# Patient Record
Sex: Male | Born: 1995 | Race: Black or African American | Hispanic: No | Marital: Single | Smoking: Never smoker
Health system: Southern US, Community
[De-identification: ages and names within clinical notes are randomized; demographics above are authoritative.]

## PROBLEM LIST (undated history)

## (undated) DIAGNOSIS — F419 Anxiety disorder, unspecified: Secondary | ICD-10-CM

## (undated) DIAGNOSIS — F32A Depression, unspecified: Secondary | ICD-10-CM

## (undated) DIAGNOSIS — I10 Essential (primary) hypertension: Secondary | ICD-10-CM

## (undated) DIAGNOSIS — F329 Major depressive disorder, single episode, unspecified: Secondary | ICD-10-CM

## (undated) DIAGNOSIS — J189 Pneumonia, unspecified organism: Secondary | ICD-10-CM

## (undated) HISTORY — PX: WISDOM TOOTH EXTRACTION: SHX21

## (undated) HISTORY — DX: Essential (primary) hypertension: I10

---
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
## 2019-03-26 ENCOUNTER — Encounter (HOSPITAL_COMMUNITY): Payer: Self-pay

## 2019-03-26 ENCOUNTER — Other Ambulatory Visit: Payer: Self-pay

## 2019-03-26 ENCOUNTER — Emergency Department (HOSPITAL_COMMUNITY)
Admission: EM | Admit: 2019-03-26 | Discharge: 2019-03-26 | Disposition: A | Discharge status: Home / Self Care | Payer: Managed Care, Other (non HMO) | Attending: Emergency Medicine | Admitting: Emergency Medicine

## 2019-03-26 ENCOUNTER — Emergency Department (HOSPITAL_COMMUNITY): Payer: Managed Care, Other (non HMO)

## 2019-03-26 DIAGNOSIS — S3992XA Unspecified injury of lower back, initial encounter: Secondary | ICD-10-CM | POA: Diagnosis present

## 2019-03-26 DIAGNOSIS — M545 Low back pain, unspecified: Secondary | ICD-10-CM

## 2019-03-26 DIAGNOSIS — S32038A Other fracture of third lumbar vertebra, initial encounter for closed fracture: Secondary | ICD-10-CM | POA: Insufficient documentation

## 2019-03-26 DIAGNOSIS — Y939 Activity, unspecified: Secondary | ICD-10-CM | POA: Insufficient documentation

## 2019-03-26 DIAGNOSIS — Y999 Unspecified external cause status: Secondary | ICD-10-CM | POA: Insufficient documentation

## 2019-03-26 DIAGNOSIS — S129XXA Fracture of neck, unspecified, initial encounter: Secondary | ICD-10-CM

## 2019-03-26 DIAGNOSIS — S51811A Laceration without foreign body of right forearm, initial encounter: Secondary | ICD-10-CM | POA: Diagnosis not present

## 2019-03-26 DIAGNOSIS — Y92410 Unspecified street and highway as the place of occurrence of the external cause: Secondary | ICD-10-CM | POA: Diagnosis not present

## 2019-03-26 HISTORY — DX: Anxiety disorder, unspecified: F41.9

## 2019-03-26 HISTORY — DX: Depression, unspecified: F32.A

## 2019-03-26 MED ORDER — METHOCARBAMOL 500 MG PO TABS: 500.0000 mg | ORAL_TABLET | Freq: Two times a day (BID) | ORAL | 0 refills | Status: DC
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
## 2019-08-08 DIAGNOSIS — S335XXA Sprain of ligaments of lumbar spine, initial encounter: Secondary | ICD-10-CM | POA: Diagnosis not present

## 2019-08-08 DIAGNOSIS — S335XXD Sprain of ligaments of lumbar spine, subsequent encounter: Secondary | ICD-10-CM | POA: Diagnosis not present

## 2019-08-08 DIAGNOSIS — S33130A Subluxation of L3/L4 lumbar vertebra, initial encounter: Secondary | ICD-10-CM | POA: Diagnosis not present

## 2019-08-08 DIAGNOSIS — S33130D Subluxation of L3/L4 lumbar vertebra, subsequent encounter: Secondary | ICD-10-CM | POA: Diagnosis not present

## 2019-08-12 DIAGNOSIS — S33130D Subluxation of L3/L4 lumbar vertebra, subsequent encounter: Secondary | ICD-10-CM | POA: Diagnosis not present

## 2019-08-12 DIAGNOSIS — S335XXA Sprain of ligaments of lumbar spine, initial encounter: Secondary | ICD-10-CM | POA: Diagnosis not present

## 2019-08-12 DIAGNOSIS — S335XXD Sprain of ligaments of lumbar spine, subsequent encounter: Secondary | ICD-10-CM | POA: Diagnosis not present

## 2019-08-12 DIAGNOSIS — S33130A Subluxation of L3/L4 lumbar vertebra, initial encounter: Secondary | ICD-10-CM | POA: Diagnosis not present

## 2019-08-13 DIAGNOSIS — S33130A Subluxation of L3/L4 lumbar vertebra, initial encounter: Secondary | ICD-10-CM | POA: Diagnosis not present

## 2019-08-13 DIAGNOSIS — S335XXA Sprain of ligaments of lumbar spine, initial encounter: Secondary | ICD-10-CM | POA: Diagnosis not present

## 2019-08-13 DIAGNOSIS — M955 Acquired deformity of pelvis: Secondary | ICD-10-CM | POA: Diagnosis not present

## 2019-08-13 DIAGNOSIS — S33130D Subluxation of L3/L4 lumbar vertebra, subsequent encounter: Secondary | ICD-10-CM | POA: Diagnosis not present

## 2019-08-13 DIAGNOSIS — S335XXD Sprain of ligaments of lumbar spine, subsequent encounter: Secondary | ICD-10-CM | POA: Diagnosis not present

## 2019-08-15 DIAGNOSIS — S33130A Subluxation of L3/L4 lumbar vertebra, initial encounter: Secondary | ICD-10-CM | POA: Diagnosis not present

## 2019-08-15 DIAGNOSIS — S33130D Subluxation of L3/L4 lumbar vertebra, subsequent encounter: Secondary | ICD-10-CM | POA: Diagnosis not present

## 2019-08-15 DIAGNOSIS — S335XXA Sprain of ligaments of lumbar spine, initial encounter: Secondary | ICD-10-CM | POA: Diagnosis not present

## 2019-08-15 DIAGNOSIS — S335XXD Sprain of ligaments of lumbar spine, subsequent encounter: Secondary | ICD-10-CM | POA: Diagnosis not present

## 2019-08-19 DIAGNOSIS — S33130A Subluxation of L3/L4 lumbar vertebra, initial encounter: Secondary | ICD-10-CM | POA: Diagnosis not present

## 2019-08-19 DIAGNOSIS — S335XXA Sprain of ligaments of lumbar spine, initial encounter: Secondary | ICD-10-CM | POA: Diagnosis not present

## 2019-08-19 DIAGNOSIS — S33130D Subluxation of L3/L4 lumbar vertebra, subsequent encounter: Secondary | ICD-10-CM | POA: Diagnosis not present

## 2019-08-19 DIAGNOSIS — S335XXD Sprain of ligaments of lumbar spine, subsequent encounter: Secondary | ICD-10-CM | POA: Diagnosis not present

## 2019-08-20 DIAGNOSIS — S335XXA Sprain of ligaments of lumbar spine, initial encounter: Secondary | ICD-10-CM | POA: Diagnosis not present

## 2019-08-20 DIAGNOSIS — S33130D Subluxation of L3/L4 lumbar vertebra, subsequent encounter: Secondary | ICD-10-CM | POA: Diagnosis not present

## 2019-08-20 DIAGNOSIS — S335XXD Sprain of ligaments of lumbar spine, subsequent encounter: Secondary | ICD-10-CM | POA: Diagnosis not present

## 2019-08-20 DIAGNOSIS — S33130A Subluxation of L3/L4 lumbar vertebra, initial encounter: Secondary | ICD-10-CM | POA: Diagnosis not present

## 2019-08-22 DIAGNOSIS — S335XXA Sprain of ligaments of lumbar spine, initial encounter: Secondary | ICD-10-CM | POA: Diagnosis not present

## 2019-08-22 DIAGNOSIS — S33130D Subluxation of L3/L4 lumbar vertebra, subsequent encounter: Secondary | ICD-10-CM | POA: Diagnosis not present

## 2019-08-22 DIAGNOSIS — S335XXD Sprain of ligaments of lumbar spine, subsequent encounter: Secondary | ICD-10-CM | POA: Diagnosis not present

## 2019-08-22 DIAGNOSIS — S33130A Subluxation of L3/L4 lumbar vertebra, initial encounter: Secondary | ICD-10-CM | POA: Diagnosis not present

## 2019-08-26 DIAGNOSIS — S33130D Subluxation of L3/L4 lumbar vertebra, subsequent encounter: Secondary | ICD-10-CM | POA: Diagnosis not present

## 2019-08-26 DIAGNOSIS — S335XXD Sprain of ligaments of lumbar spine, subsequent encounter: Secondary | ICD-10-CM | POA: Diagnosis not present

## 2019-08-26 DIAGNOSIS — S33130A Subluxation of L3/L4 lumbar vertebra, initial encounter: Secondary | ICD-10-CM | POA: Diagnosis not present

## 2019-08-26 DIAGNOSIS — S335XXA Sprain of ligaments of lumbar spine, initial encounter: Secondary | ICD-10-CM | POA: Diagnosis not present

## 2019-08-27 DIAGNOSIS — S33130D Subluxation of L3/L4 lumbar vertebra, subsequent encounter: Secondary | ICD-10-CM | POA: Diagnosis not present

## 2019-08-27 DIAGNOSIS — S33130A Subluxation of L3/L4 lumbar vertebra, initial encounter: Secondary | ICD-10-CM | POA: Diagnosis not present

## 2019-08-27 DIAGNOSIS — S335XXD Sprain of ligaments of lumbar spine, subsequent encounter: Secondary | ICD-10-CM | POA: Diagnosis not present

## 2019-08-27 DIAGNOSIS — S335XXA Sprain of ligaments of lumbar spine, initial encounter: Secondary | ICD-10-CM | POA: Diagnosis not present

## 2019-08-29 DIAGNOSIS — S33130A Subluxation of L3/L4 lumbar vertebra, initial encounter: Secondary | ICD-10-CM | POA: Diagnosis not present

## 2019-08-29 DIAGNOSIS — S335XXA Sprain of ligaments of lumbar spine, initial encounter: Secondary | ICD-10-CM | POA: Diagnosis not present

## 2019-08-29 DIAGNOSIS — S335XXD Sprain of ligaments of lumbar spine, subsequent encounter: Secondary | ICD-10-CM | POA: Diagnosis not present

## 2019-08-29 DIAGNOSIS — S33130D Subluxation of L3/L4 lumbar vertebra, subsequent encounter: Secondary | ICD-10-CM | POA: Diagnosis not present

## 2019-09-02 DIAGNOSIS — S335XXD Sprain of ligaments of lumbar spine, subsequent encounter: Secondary | ICD-10-CM | POA: Diagnosis not present

## 2019-09-02 DIAGNOSIS — S33130A Subluxation of L3/L4 lumbar vertebra, initial encounter: Secondary | ICD-10-CM | POA: Diagnosis not present

## 2019-09-02 DIAGNOSIS — S33130D Subluxation of L3/L4 lumbar vertebra, subsequent encounter: Secondary | ICD-10-CM | POA: Diagnosis not present

## 2019-09-02 DIAGNOSIS — S335XXA Sprain of ligaments of lumbar spine, initial encounter: Secondary | ICD-10-CM | POA: Diagnosis not present

## 2019-09-03 DIAGNOSIS — S33130A Subluxation of L3/L4 lumbar vertebra, initial encounter: Secondary | ICD-10-CM | POA: Diagnosis not present

## 2019-09-03 DIAGNOSIS — S335XXD Sprain of ligaments of lumbar spine, subsequent encounter: Secondary | ICD-10-CM | POA: Diagnosis not present

## 2019-09-03 DIAGNOSIS — S335XXA Sprain of ligaments of lumbar spine, initial encounter: Secondary | ICD-10-CM | POA: Diagnosis not present

## 2019-09-03 DIAGNOSIS — S33130D Subluxation of L3/L4 lumbar vertebra, subsequent encounter: Secondary | ICD-10-CM | POA: Diagnosis not present

## 2019-09-05 DIAGNOSIS — S33130D Subluxation of L3/L4 lumbar vertebra, subsequent encounter: Secondary | ICD-10-CM | POA: Diagnosis not present

## 2019-09-05 DIAGNOSIS — S335XXD Sprain of ligaments of lumbar spine, subsequent encounter: Secondary | ICD-10-CM | POA: Diagnosis not present

## 2019-09-05 DIAGNOSIS — S33130A Subluxation of L3/L4 lumbar vertebra, initial encounter: Secondary | ICD-10-CM | POA: Diagnosis not present

## 2019-09-05 DIAGNOSIS — S335XXA Sprain of ligaments of lumbar spine, initial encounter: Secondary | ICD-10-CM | POA: Diagnosis not present

## 2019-09-11 DIAGNOSIS — S33130D Subluxation of L3/L4 lumbar vertebra, subsequent encounter: Secondary | ICD-10-CM | POA: Diagnosis not present

## 2019-09-11 DIAGNOSIS — S33130A Subluxation of L3/L4 lumbar vertebra, initial encounter: Secondary | ICD-10-CM | POA: Diagnosis not present

## 2019-09-11 DIAGNOSIS — S335XXA Sprain of ligaments of lumbar spine, initial encounter: Secondary | ICD-10-CM | POA: Diagnosis not present

## 2019-09-11 DIAGNOSIS — S335XXD Sprain of ligaments of lumbar spine, subsequent encounter: Secondary | ICD-10-CM | POA: Diagnosis not present

## 2019-09-17 DIAGNOSIS — S33130A Subluxation of L3/L4 lumbar vertebra, initial encounter: Secondary | ICD-10-CM | POA: Diagnosis not present

## 2019-09-17 DIAGNOSIS — S335XXA Sprain of ligaments of lumbar spine, initial encounter: Secondary | ICD-10-CM | POA: Diagnosis not present

## 2019-09-17 DIAGNOSIS — S33130D Subluxation of L3/L4 lumbar vertebra, subsequent encounter: Secondary | ICD-10-CM | POA: Diagnosis not present

## 2019-09-17 DIAGNOSIS — S335XXD Sprain of ligaments of lumbar spine, subsequent encounter: Secondary | ICD-10-CM | POA: Diagnosis not present

## 2019-09-19 DIAGNOSIS — S33130D Subluxation of L3/L4 lumbar vertebra, subsequent encounter: Secondary | ICD-10-CM | POA: Diagnosis not present

## 2019-09-19 DIAGNOSIS — S335XXA Sprain of ligaments of lumbar spine, initial encounter: Secondary | ICD-10-CM | POA: Diagnosis not present

## 2019-09-19 DIAGNOSIS — S335XXD Sprain of ligaments of lumbar spine, subsequent encounter: Secondary | ICD-10-CM | POA: Diagnosis not present

## 2019-09-19 DIAGNOSIS — S33130A Subluxation of L3/L4 lumbar vertebra, initial encounter: Secondary | ICD-10-CM | POA: Diagnosis not present

## 2019-09-24 DIAGNOSIS — S335XXA Sprain of ligaments of lumbar spine, initial encounter: Secondary | ICD-10-CM | POA: Diagnosis not present

## 2019-09-24 DIAGNOSIS — S33130D Subluxation of L3/L4 lumbar vertebra, subsequent encounter: Secondary | ICD-10-CM | POA: Diagnosis not present

## 2019-09-24 DIAGNOSIS — S335XXD Sprain of ligaments of lumbar spine, subsequent encounter: Secondary | ICD-10-CM | POA: Diagnosis not present

## 2019-09-24 DIAGNOSIS — S33130A Subluxation of L3/L4 lumbar vertebra, initial encounter: Secondary | ICD-10-CM | POA: Diagnosis not present

## 2019-09-26 DIAGNOSIS — S33130D Subluxation of L3/L4 lumbar vertebra, subsequent encounter: Secondary | ICD-10-CM | POA: Diagnosis not present

## 2019-09-26 DIAGNOSIS — S335XXD Sprain of ligaments of lumbar spine, subsequent encounter: Secondary | ICD-10-CM | POA: Diagnosis not present

## 2019-09-26 DIAGNOSIS — S335XXA Sprain of ligaments of lumbar spine, initial encounter: Secondary | ICD-10-CM | POA: Diagnosis not present

## 2019-09-26 DIAGNOSIS — S33130A Subluxation of L3/L4 lumbar vertebra, initial encounter: Secondary | ICD-10-CM | POA: Diagnosis not present

## 2019-10-01 DIAGNOSIS — S33130A Subluxation of L3/L4 lumbar vertebra, initial encounter: Secondary | ICD-10-CM | POA: Diagnosis not present

## 2019-10-01 DIAGNOSIS — S335XXA Sprain of ligaments of lumbar spine, initial encounter: Secondary | ICD-10-CM | POA: Diagnosis not present

## 2019-10-01 DIAGNOSIS — S335XXD Sprain of ligaments of lumbar spine, subsequent encounter: Secondary | ICD-10-CM | POA: Diagnosis not present

## 2019-10-01 DIAGNOSIS — S33130D Subluxation of L3/L4 lumbar vertebra, subsequent encounter: Secondary | ICD-10-CM | POA: Diagnosis not present

## 2019-10-03 DIAGNOSIS — S33130A Subluxation of L3/L4 lumbar vertebra, initial encounter: Secondary | ICD-10-CM | POA: Diagnosis not present

## 2019-10-03 DIAGNOSIS — S335XXD Sprain of ligaments of lumbar spine, subsequent encounter: Secondary | ICD-10-CM | POA: Diagnosis not present

## 2019-10-03 DIAGNOSIS — S335XXA Sprain of ligaments of lumbar spine, initial encounter: Secondary | ICD-10-CM | POA: Diagnosis not present

## 2019-10-03 DIAGNOSIS — S33130D Subluxation of L3/L4 lumbar vertebra, subsequent encounter: Secondary | ICD-10-CM | POA: Diagnosis not present

## 2019-10-08 DIAGNOSIS — S33130A Subluxation of L3/L4 lumbar vertebra, initial encounter: Secondary | ICD-10-CM | POA: Diagnosis not present

## 2019-10-08 DIAGNOSIS — S335XXD Sprain of ligaments of lumbar spine, subsequent encounter: Secondary | ICD-10-CM | POA: Diagnosis not present

## 2019-10-08 DIAGNOSIS — S335XXA Sprain of ligaments of lumbar spine, initial encounter: Secondary | ICD-10-CM | POA: Diagnosis not present

## 2019-10-08 DIAGNOSIS — S33130D Subluxation of L3/L4 lumbar vertebra, subsequent encounter: Secondary | ICD-10-CM | POA: Diagnosis not present

## 2019-10-17 DIAGNOSIS — S335XXD Sprain of ligaments of lumbar spine, subsequent encounter: Secondary | ICD-10-CM | POA: Diagnosis not present

## 2019-10-17 DIAGNOSIS — S335XXA Sprain of ligaments of lumbar spine, initial encounter: Secondary | ICD-10-CM | POA: Diagnosis not present

## 2019-10-17 DIAGNOSIS — S33130D Subluxation of L3/L4 lumbar vertebra, subsequent encounter: Secondary | ICD-10-CM | POA: Diagnosis not present

## 2019-10-17 DIAGNOSIS — S33130A Subluxation of L3/L4 lumbar vertebra, initial encounter: Secondary | ICD-10-CM | POA: Diagnosis not present

## 2020-05-22 ENCOUNTER — Other Ambulatory Visit: Payer: Self-pay

## 2020-05-22 MED ORDER — AMOXICILLIN 500 MG PO CAPS
ORAL_CAPSULE | ORAL | 0 refills | Status: DC
  Filled 2020-05-22: qty 21, 7d supply, fill #0

## 2020-06-22 ENCOUNTER — Other Ambulatory Visit: Payer: Self-pay

## 2020-06-22 MED ORDER — TRIAMCINOLONE ACETONIDE 55 MCG/ACT NA AERO: INHALATION_SPRAY | NASAL | 12 refills | Status: DC

## 2020-06-22 MED ORDER — AZELASTINE HCL 0.1 % NA SOLN
NASAL | 12 refills | Status: DC
  Filled 2020-06-22 – 2020-07-13 (×2): qty 30, 30d supply, fill #0

## 2020-07-06 ENCOUNTER — Other Ambulatory Visit: Payer: Self-pay

## 2020-07-13 ENCOUNTER — Other Ambulatory Visit: Payer: Self-pay

## 2020-07-13 MED ORDER — AMOXICILLIN 500 MG PO CAPS
500.0000 mg | ORAL_CAPSULE | ORAL | 0 refills | Status: DC
  Filled 2020-07-13: qty 2, 1d supply, fill #0

## 2020-07-13 MED ORDER — IBUPROFEN 600 MG PO TABS
600.0000 mg | ORAL_TABLET | ORAL | 0 refills | Status: DC
  Filled 2020-07-13: qty 18, 6d supply, fill #0

## 2020-07-13 MED ORDER — CHLORHEXIDINE GLUCONATE 0.12 % MT SOLN
OROMUCOSAL | 99 refills | Status: DC
  Filled 2020-07-13: qty 473, 14d supply, fill #0

## 2020-07-14 ENCOUNTER — Other Ambulatory Visit: Payer: Self-pay

## 2020-07-16 ENCOUNTER — Other Ambulatory Visit: Payer: Self-pay

## 2020-07-16 MED ORDER — HYDROCODONE-ACETAMINOPHEN 5-325 MG PO TABS
ORAL_TABLET | ORAL | 0 refills | Status: DC
  Filled 2020-07-16: qty 16, 3d supply, fill #0

## 2020-07-16 MED ORDER — AMOXICILLIN 500 MG PO CAPS
ORAL_CAPSULE | ORAL | 0 refills | Status: DC
  Filled 2020-07-16: qty 21, 7d supply, fill #0

## 2020-08-17 ENCOUNTER — Telehealth: Payer: Managed Care, Other (non HMO)

## 2020-12-04 ENCOUNTER — Other Ambulatory Visit: Payer: Self-pay

## 2020-12-04 MED ORDER — LOSARTAN POTASSIUM 50 MG PO TABS
50.0000 mg | ORAL_TABLET | Freq: Every day | ORAL | 1 refills | Status: DC
  Filled 2020-12-04 – 2021-02-16 (×2): qty 30, 30d supply, fill #0

## 2020-12-14 ENCOUNTER — Encounter: Payer: Self-pay | Admitting: *Deleted

## 2020-12-18 ENCOUNTER — Other Ambulatory Visit: Payer: Self-pay

## 2021-02-16 ENCOUNTER — Other Ambulatory Visit: Payer: Self-pay

## 2021-02-16 NOTE — Progress Notes (Signed)
02/17/2021 8:54 AM   Lee Jones 06-08-1995 [id number]  Referring provider: Alm Bustard, NP [address] [place],  [place] [address]  Chief Complaint  Patient presents with   VAS Consult      HPI: Lee Jones is a 26 y.o. male who presents today for vasectomy consult .   He denies a history of testicular trauma or pain.  No urinary issues.  No previous scrotal surgeries.  He is accompanied by his partner. He has no children and his is interested in learning about reversal.    PMH: Past Medical History:  Diagnosis Date   Anxiety    Depression    Hypertension     Surgical History: History reviewed. No pertinent surgical history.  Home Medications:  Allergies as of 02/17/2021   No Known Allergies      Medication List        Accurate as of February 17, 2021  8:54 AM. If you have any questions, ask your nurse or doctor.          STOP taking these medications    amoxicillin 500 MG capsule Commonly known as: AMOXIL Stopped by: Vanna Scotland, MD   azelastine 0.1 % nasal spray Commonly known as: ASTELIN Stopped by: Vanna Scotland, MD   chlorhexidine 0.12 % solution Commonly known as: PERIDEX Stopped by: Vanna Scotland, MD   HYDROcodone-acetaminophen 5-325 MG tablet Commonly known as: NORCO/VICODIN Stopped by: Vanna Scotland, MD   ibuprofen 600 MG tablet Commonly known as: ADVIL Stopped by: Vanna Scotland, MD   methocarbamol 500 MG tablet Commonly known as: ROBAXIN Stopped by: Vanna Scotland, MD   triamcinolone 55 MCG/ACT Aero nasal inhaler Commonly known as: NASACORT Stopped by: Vanna Scotland, MD       TAKE these medications    losartan 50 MG tablet Commonly known as: COZAAR Take 1 tablet (50 mg total) by mouth once daily        Allergies: No Known Allergies  Family History: Family History  Problem Relation Age of Onset   Chronic Renal Failure Mother    High Cholesterol Mother    Hyperlipidemia Father     Heart failure Father     Social History:  reports that he has never smoked. He has never used smokeless tobacco. He reports current alcohol use. He reports that he does not use drugs.   Physical Exam: BP (!) 171/118    Pulse 96    Ht 5\' 9"  (1.753 m)    Wt (!) 320 lb (145.2 kg)    BMI 47.26 kg/m   Constitutional:  Alert and oriented, No acute distress. HEENT: [place] AT, moist mucus membranes.  Trachea midline, no masses. Cardiovascular: No clubbing, cyanosis, or edema. Respiratory: Normal respiratory effort, no increased work of breathing. GI: Abdomen is soft, nontender, nondistended, no abdominal masses GU: Normal phallus.  Bilateral descended testicles without masses.  Despite larger habitus, vasa are easily palpable both in the supine and standing position. Skin: No rashes, bruises or suspicious lesions. Neurologic: Grossly intact, no focal deficits, moving all 4 extremities. Psychiatric: Normal mood and affect.   Assessment & Plan:    1. Vasectomy evaluation Today, we discussed what the vas deferens is, where it is located, and its function. We reviewed the procedure for vasectomy, it's risks, benefits, alternatives, and likelihood of achieving his goals. We discussed in detail the procedure, complications, and recovery as well as the need for clearance prior to unprotected intercourse. We discussed that vasectomy does not protect against  sexually transmitted diseases. We discussed that this procedure does not result in immediate sterility and that they would need to use other forms of birth control until he has been cleared with negative postvasectomy semen analyses. I explained that the procedure is considered to be permanent and that attempts at reversal have varying degrees of success. These options include vasectomy reversal, sperm retrieval, and in vitro fertilization; these can be very expensive. We discussed the chance of postvasectomy pain syndrome which occurs in less than 5% of  patients. I explained to the patient that there is no treatment to resolve this chronic pain, and that if it developed I would not be able to help resolve the issue, but that surgery is generally not needed for correction. I explained there have even been reports of systemic like illness associated with this chronic pain, and that there was no good cure. I explained that vasectomy it is not a 100% reliable form of birth control, and the risk of pregnancy after vasectomy is approximately 1 in 2000 men who had a negative postvasectomy semen analysis or rare non-motile sperm. I explained that repeat vasectomy was necessary in less than 1% of vasectomy procedures when employing the type of technique that I use. I explained that he should refrain from ejaculation for approximately one week following vasectomy. I explained that there are other options for birth control which are permanent and non-permanent; we discussed these. I explained the rates of surgical complications, such as symptomatic hematoma or infection, are low (1-2%) and vary with the surgeon's experience and criteria used to diagnose the complication.   The patient had the opportunity to ask questions to his stated satisfaction. He voiced understanding of the above factors and stated that he has read all the information provided to him and the packets and informed consent.  Given that they have some questions about his reversal today which were discussed extensively, would like him to go home and discuss this further amongst himself to be sure that they are on the same page.  We discussed that they should consider this as a permanent form of sterilization considering the barriers including financial and efficacy rates for reversal.  - He will call if he would like to schedule     I,Kailey Littlejohn,acting as a scribe for Vanna Scotland, MD.,have documented all relevant documentation on the behalf of Vanna Scotland, MD,as directed by  Vanna Scotland, MD while in the presence of Vanna Scotland, MD.  I have reviewed the above documentation for accuracy and completeness, and I agree with the above.   Vanna Scotland, MD   [facility] [address] [place], [place] [address] [phone]

## 2021-02-17 ENCOUNTER — Other Ambulatory Visit: Payer: Self-pay

## 2021-02-17 ENCOUNTER — Ambulatory Visit (INDEPENDENT_AMBULATORY_CARE_PROVIDER_SITE_OTHER): Payer: No Typology Code available for payment source | Admitting: Urology

## 2021-02-17 ENCOUNTER — Encounter: Payer: Self-pay | Admitting: Urology

## 2021-02-17 VITALS — BP 171/118 | HR 96 | Ht 69.0 in | Wt 320.0 lb

## 2021-02-17 DIAGNOSIS — Z3009 Encounter for other general counseling and advice on contraception: Secondary | ICD-10-CM | POA: Diagnosis not present

## 2021-02-17 NOTE — Patient Instructions (Addendum)

## 2021-02-25 ENCOUNTER — Other Ambulatory Visit: Payer: Self-pay

## 2021-02-25 MED ORDER — AMLODIPINE BESYLATE 5 MG PO TABS
ORAL_TABLET | ORAL | 0 refills | Status: DC
  Filled 2021-02-25: qty 30, 30d supply, fill #0

## 2021-02-26 ENCOUNTER — Other Ambulatory Visit: Payer: Self-pay

## 2021-02-26 MED ORDER — AMLODIPINE BESYLATE 10 MG PO TABS
10.0000 mg | ORAL_TABLET | Freq: Every day | ORAL | 11 refills | Status: DC
  Filled 2021-02-26: qty 90, 90d supply, fill #0

## 2021-02-26 MED ORDER — LOSARTAN POTASSIUM 100 MG PO TABS
ORAL_TABLET | ORAL | 11 refills | Status: DC
  Filled 2021-02-26: qty 90, 90d supply, fill #0
  Filled 2021-06-22: qty 90, 90d supply, fill #1

## 2021-03-03 ENCOUNTER — Other Ambulatory Visit: Payer: Self-pay

## 2021-03-03 MED ORDER — ESCITALOPRAM OXALATE 5 MG PO TABS
ORAL_TABLET | ORAL | 2 refills | Status: DC
  Filled 2021-03-03: qty 30, 30d supply, fill #0
  Filled 2021-04-07: qty 30, 30d supply, fill #1
  Filled 2021-05-13: qty 30, 30d supply, fill #2

## 2021-03-04 ENCOUNTER — Other Ambulatory Visit: Payer: Self-pay

## 2021-03-04 MED ORDER — TIZANIDINE HCL 2 MG PO TABS
ORAL_TABLET | ORAL | 0 refills | Status: DC
  Filled 2021-03-04: qty 15, 15d supply, fill #0

## 2021-03-12 ENCOUNTER — Other Ambulatory Visit: Payer: Self-pay

## 2021-03-12 MED ORDER — HYDROCHLOROTHIAZIDE 12.5 MG PO TABS
ORAL_TABLET | ORAL | 11 refills | Status: DC
  Filled 2021-03-12: qty 90, 90d supply, fill #0

## 2021-03-25 ENCOUNTER — Other Ambulatory Visit: Payer: Self-pay
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
## 2021-04-05 ENCOUNTER — Other Ambulatory Visit: Payer: Self-pay

## 2021-04-05 MED ORDER — DILTIAZEM HCL ER COATED BEADS 240 MG PO CP24
ORAL_CAPSULE | ORAL | 11 refills | Status: DC
  Filled 2021-04-05: qty 30, 30d supply, fill #0
  Filled 2021-05-13: qty 90, 90d supply, fill #1

## 2021-04-07 ENCOUNTER — Other Ambulatory Visit: Payer: Self-pay

## 2021-05-13 ENCOUNTER — Other Ambulatory Visit: Payer: Self-pay

## 2021-05-14 ENCOUNTER — Other Ambulatory Visit: Payer: Self-pay

## 2021-06-22 ENCOUNTER — Other Ambulatory Visit: Payer: Self-pay

## 2021-06-22 MED ORDER — ESCITALOPRAM OXALATE 5 MG PO TABS
ORAL_TABLET | ORAL | 2 refills | Status: DC
  Filled 2021-06-22: qty 30, 30d supply, fill #0

## 2021-06-23 ENCOUNTER — Other Ambulatory Visit: Payer: Self-pay

## 2021-08-04 ENCOUNTER — Other Ambulatory Visit: Payer: Self-pay

## 2021-08-04 MED ORDER — DILTIAZEM HCL ER COATED BEADS 240 MG PO CP24
ORAL_CAPSULE | ORAL | 1 refills | Status: DC
  Filled 2021-08-04: qty 90, 90d supply, fill #0

## 2021-08-04 MED ORDER — TELMISARTAN 40 MG PO TABS: ORAL_TABLET | ORAL | 1 refills | Status: DC

## 2021-08-04 MED ORDER — LOSARTAN POTASSIUM 100 MG PO TABS
ORAL_TABLET | ORAL | 1 refills | Status: DC
  Filled 2021-08-04 – 2021-10-04 (×2): qty 90, 90d supply, fill #0

## 2021-09-28 ENCOUNTER — Other Ambulatory Visit: Payer: Self-pay

## 2021-09-28 MED ORDER — EPINEPHRINE 0.3 MG/0.3ML IJ SOAJ
INTRAMUSCULAR | 0 refills | Status: DC
  Filled 2021-09-28: qty 2, 30d supply, fill #0

## 2021-10-04 ENCOUNTER — Other Ambulatory Visit: Payer: Self-pay

## 2021-11-29 ENCOUNTER — Other Ambulatory Visit: Payer: Self-pay

## 2021-11-29 MED ORDER — DILTIAZEM HCL ER COATED BEADS 240 MG PO CP24
240.0000 mg | ORAL_CAPSULE | Freq: Every day | ORAL | 3 refills | Status: DC
  Filled 2021-11-29: qty 46, 46d supply, fill #0
  Filled 2021-11-29: qty 44, 44d supply, fill #0

## 2021-11-29 MED ORDER — LOSARTAN POTASSIUM 100 MG PO TABS
100.0000 mg | ORAL_TABLET | Freq: Every day | ORAL | 3 refills | Status: DC
  Filled 2021-11-29 – 2022-01-05 (×2): qty 90, 90d supply, fill #0

## 2021-11-29 MED ORDER — EPINEPHRINE 0.3 MG/0.3ML IJ SOAJ
0.3000 mg | INTRAMUSCULAR | 1 refills | Status: AC | PRN
  Filled 2021-11-29: qty 1, 30d supply, fill #0

## 2021-12-03 ENCOUNTER — Other Ambulatory Visit: Payer: Self-pay

## 2021-12-16 ENCOUNTER — Ambulatory Visit: Payer: Managed Care, Other (non HMO)

## 2021-12-17 ENCOUNTER — Other Ambulatory Visit: Payer: Self-pay | Admitting: Physician Assistant

## 2021-12-17 ENCOUNTER — Ambulatory Visit
Admission: RE | Admit: 2021-12-17 | Discharge: 2021-12-17 | Disposition: A | Discharge status: Home / Self Care | Payer: No Typology Code available for payment source | Source: Ambulatory Visit | Attending: Physician Assistant | Admitting: Physician Assistant

## 2021-12-17 ENCOUNTER — Other Ambulatory Visit: Payer: Self-pay

## 2021-12-17 DIAGNOSIS — N5082 Scrotal pain: Secondary | ICD-10-CM

## 2021-12-17 MED ORDER — CIPROFLOXACIN HCL 500 MG PO TABS
ORAL_TABLET | ORAL | 0 refills | Status: DC
  Filled 2021-12-17: qty 20, 10d supply, fill #0

## 2022-01-05 ENCOUNTER — Other Ambulatory Visit: Payer: Self-pay

## 2022-02-14 ENCOUNTER — Other Ambulatory Visit: Payer: Self-pay

## 2022-02-14 MED ORDER — ESCITALOPRAM OXALATE 5 MG PO TABS
5.0000 mg | ORAL_TABLET | Freq: Every day | ORAL | 2 refills | Status: DC
  Filled 2022-02-14 – 2022-11-21 (×3): qty 30, 30d supply, fill #0

## 2022-02-17 ENCOUNTER — Other Ambulatory Visit: Payer: Self-pay

## 2022-03-29 ENCOUNTER — Other Ambulatory Visit (HOSPITAL_COMMUNITY): Payer: Self-pay

## 2022-03-29 MED ORDER — LOSARTAN POTASSIUM 100 MG PO TABS
100.0000 mg | ORAL_TABLET | Freq: Every day | ORAL | 1 refills | Status: DC
  Filled 2022-03-29: qty 90, 90d supply, fill #0
  Filled 2023-03-20: qty 90, 90d supply, fill #1
  Filled 2023-03-23: qty 90, 90d supply, fill #0

## 2022-03-29 MED ORDER — DILTIAZEM HCL ER COATED BEADS 240 MG PO CP24
240.0000 mg | ORAL_CAPSULE | Freq: Every day | ORAL | 1 refills | Status: DC
  Filled 2022-03-29: qty 90, 90d supply, fill #0

## 2022-03-29 MED ORDER — ESCITALOPRAM OXALATE 5 MG PO TABS
5.0000 mg | ORAL_TABLET | Freq: Every day | ORAL | 0 refills | Status: DC
  Filled 2022-03-29: qty 30, 30d supply, fill #0

## 2022-04-19 DIAGNOSIS — R000 Tachycardia, unspecified: Secondary | ICD-10-CM | POA: Diagnosis not present

## 2022-04-19 DIAGNOSIS — Z6841 Body Mass Index (BMI) 40.0 and over, adult: Secondary | ICD-10-CM | POA: Diagnosis not present

## 2022-04-19 DIAGNOSIS — R0602 Shortness of breath: Secondary | ICD-10-CM | POA: Diagnosis not present

## 2022-04-19 DIAGNOSIS — I10 Essential (primary) hypertension: Secondary | ICD-10-CM | POA: Diagnosis not present

## 2022-04-19 DIAGNOSIS — R002 Palpitations: Secondary | ICD-10-CM | POA: Diagnosis not present

## 2022-05-12 ENCOUNTER — Other Ambulatory Visit (HOSPITAL_COMMUNITY): Payer: Self-pay

## 2022-05-12 DIAGNOSIS — H6692 Otitis media, unspecified, left ear: Secondary | ICD-10-CM | POA: Diagnosis not present

## 2022-05-12 DIAGNOSIS — I10 Essential (primary) hypertension: Secondary | ICD-10-CM | POA: Diagnosis not present

## 2022-05-12 MED ORDER — AMOXICILLIN-POT CLAVULANATE 875-125 MG PO TABS
1.0000 | ORAL_TABLET | Freq: Two times a day (BID) | ORAL | 0 refills | Status: DC
  Filled 2022-05-12: qty 14, 7d supply, fill #0

## 2022-05-12 MED ORDER — PREDNISONE 10 MG PO TABS
ORAL_TABLET | ORAL | 0 refills | Status: DC
  Filled 2022-05-12: qty 20, 8d supply, fill #0

## 2022-05-12 MED ORDER — ESCITALOPRAM OXALATE 5 MG PO TABS
5.0000 mg | ORAL_TABLET | Freq: Every day | ORAL | 1 refills | Status: DC
  Filled 2022-05-12: qty 90, 90d supply, fill #0
  Filled 2022-08-17: qty 90, 90d supply, fill #1

## 2022-06-27 ENCOUNTER — Other Ambulatory Visit (HOSPITAL_COMMUNITY): Payer: Self-pay

## 2022-06-27 DIAGNOSIS — H6993 Unspecified Eustachian tube disorder, bilateral: Secondary | ICD-10-CM | POA: Diagnosis not present

## 2022-06-27 DIAGNOSIS — H938X3 Other specified disorders of ear, bilateral: Secondary | ICD-10-CM | POA: Diagnosis not present

## 2022-06-27 MED ORDER — DILTIAZEM HCL ER COATED BEADS 240 MG PO CP24
240.0000 mg | ORAL_CAPSULE | Freq: Every day | ORAL | 1 refills | Status: DC
  Filled 2022-06-27: qty 90, 90d supply, fill #0
  Filled 2022-09-28: qty 90, 90d supply, fill #1

## 2022-06-27 MED ORDER — LOSARTAN POTASSIUM 100 MG PO TABS
100.0000 mg | ORAL_TABLET | Freq: Every day | ORAL | 1 refills | Status: DC
  Filled 2022-06-27: qty 90, 90d supply, fill #0
  Filled 2022-09-28: qty 90, 90d supply, fill #1

## 2022-06-27 MED ORDER — AMOXICILLIN-POT CLAVULANATE 875-125 MG PO TABS
875.0000 mg | ORAL_TABLET | Freq: Two times a day (BID) | ORAL | 0 refills | Status: DC
  Filled 2022-06-27: qty 14, 7d supply, fill #0

## 2022-08-17 ENCOUNTER — Other Ambulatory Visit (HOSPITAL_COMMUNITY): Payer: Self-pay

## 2022-09-28 ENCOUNTER — Other Ambulatory Visit (HOSPITAL_COMMUNITY): Payer: Self-pay

## 2022-10-17 ENCOUNTER — Other Ambulatory Visit (HOSPITAL_COMMUNITY): Payer: Self-pay

## 2022-10-17 DIAGNOSIS — Z111 Encounter for screening for respiratory tuberculosis: Secondary | ICD-10-CM | POA: Diagnosis not present

## 2022-10-17 DIAGNOSIS — R7303 Prediabetes: Secondary | ICD-10-CM | POA: Diagnosis not present

## 2022-10-17 DIAGNOSIS — I10 Essential (primary) hypertension: Secondary | ICD-10-CM | POA: Diagnosis not present

## 2022-10-17 DIAGNOSIS — H8391 Unspecified disease of right inner ear: Secondary | ICD-10-CM | POA: Diagnosis not present

## 2022-10-17 DIAGNOSIS — F419 Anxiety disorder, unspecified: Secondary | ICD-10-CM | POA: Diagnosis not present

## 2022-10-17 DIAGNOSIS — Z0000 Encounter for general adult medical examination without abnormal findings: Secondary | ICD-10-CM | POA: Diagnosis not present

## 2022-10-17 MED ORDER — AMOXICILLIN-POT CLAVULANATE 875-125 MG PO TABS
1.0000 | ORAL_TABLET | Freq: Two times a day (BID) | ORAL | 0 refills | Status: DC
  Filled 2022-10-17: qty 20, 10d supply, fill #0

## 2022-10-20 ENCOUNTER — Other Ambulatory Visit (HOSPITAL_COMMUNITY): Payer: Self-pay

## 2022-10-20 MED ORDER — DILTIAZEM HCL ER COATED BEADS 180 MG PO CP24
180.0000 mg | ORAL_CAPSULE | Freq: Every day | ORAL | 1 refills | Status: DC
  Filled 2022-10-20: qty 30, 30d supply, fill #0

## 2022-10-24 ENCOUNTER — Other Ambulatory Visit (HOSPITAL_COMMUNITY): Payer: Self-pay

## 2022-10-24 DIAGNOSIS — F419 Anxiety disorder, unspecified: Secondary | ICD-10-CM | POA: Diagnosis not present

## 2022-10-24 DIAGNOSIS — Z6841 Body Mass Index (BMI) 40.0 and over, adult: Secondary | ICD-10-CM | POA: Diagnosis not present

## 2022-10-24 DIAGNOSIS — R002 Palpitations: Secondary | ICD-10-CM | POA: Diagnosis not present

## 2022-10-24 DIAGNOSIS — R000 Tachycardia, unspecified: Secondary | ICD-10-CM | POA: Diagnosis not present

## 2022-10-24 DIAGNOSIS — E66813 Obesity, class 3: Secondary | ICD-10-CM | POA: Diagnosis not present

## 2022-10-24 DIAGNOSIS — R0602 Shortness of breath: Secondary | ICD-10-CM | POA: Diagnosis not present

## 2022-10-24 DIAGNOSIS — F320 Major depressive disorder, single episode, mild: Secondary | ICD-10-CM | POA: Diagnosis not present

## 2022-10-24 DIAGNOSIS — I10 Essential (primary) hypertension: Secondary | ICD-10-CM | POA: Diagnosis not present

## 2022-10-24 DIAGNOSIS — R7303 Prediabetes: Secondary | ICD-10-CM | POA: Diagnosis not present

## 2022-10-24 MED ORDER — DILTIAZEM HCL ER COATED BEADS 120 MG PO CP24
120.0000 mg | ORAL_CAPSULE | Freq: Every day | ORAL | 5 refills | Status: DC
  Filled 2022-10-24 – 2022-11-21 (×2): qty 30, 30d supply, fill #0
  Filled 2022-12-28 – 2023-01-05 (×7): qty 30, 30d supply, fill #1

## 2022-10-28 ENCOUNTER — Other Ambulatory Visit (HOSPITAL_COMMUNITY): Payer: Self-pay

## 2022-10-28 MED ORDER — PREDNISONE 10 MG PO TABS
ORAL_TABLET | ORAL | 0 refills | Status: DC
  Filled 2022-10-28 – 2022-11-21 (×2): qty 20, 8d supply, fill #0

## 2022-11-03 ENCOUNTER — Other Ambulatory Visit (HOSPITAL_COMMUNITY): Payer: Self-pay

## 2022-11-09 ENCOUNTER — Other Ambulatory Visit (HOSPITAL_COMMUNITY): Payer: Self-pay

## 2022-11-21 ENCOUNTER — Other Ambulatory Visit (HOSPITAL_COMMUNITY): Payer: Self-pay

## 2022-12-26 ENCOUNTER — Other Ambulatory Visit (HOSPITAL_COMMUNITY): Payer: Self-pay

## 2022-12-26 MED ORDER — DILTIAZEM HCL ER COATED BEADS 120 MG PO CP24
ORAL_CAPSULE | ORAL | 5 refills | Status: DC
  Filled 2022-12-26: qty 30, 30d supply, fill #0
  Filled 2023-01-19 – 2023-01-23 (×2): qty 30, 30d supply, fill #1

## 2022-12-26 MED ORDER — LOSARTAN POTASSIUM 100 MG PO TABS
100.0000 mg | ORAL_TABLET | Freq: Every day | ORAL | 1 refills | Status: DC
  Filled 2022-12-26: qty 90, 90d supply, fill #0

## 2022-12-26 MED ORDER — ESCITALOPRAM OXALATE 5 MG PO TABS
5.0000 mg | ORAL_TABLET | Freq: Every day | ORAL | 1 refills | Status: DC
  Filled 2022-12-26: qty 90, 90d supply, fill #0

## 2022-12-27 ENCOUNTER — Other Ambulatory Visit (HOSPITAL_COMMUNITY): Payer: Self-pay

## 2022-12-28 ENCOUNTER — Other Ambulatory Visit (HOSPITAL_COMMUNITY): Payer: Self-pay

## 2022-12-29 ENCOUNTER — Other Ambulatory Visit (HOSPITAL_COMMUNITY): Payer: Self-pay

## 2022-12-30 ENCOUNTER — Other Ambulatory Visit (HOSPITAL_COMMUNITY): Payer: Self-pay

## 2023-01-02 ENCOUNTER — Other Ambulatory Visit (HOSPITAL_COMMUNITY): Payer: Self-pay

## 2023-01-03 ENCOUNTER — Other Ambulatory Visit (HOSPITAL_COMMUNITY): Payer: Self-pay

## 2023-01-04 ENCOUNTER — Other Ambulatory Visit (HOSPITAL_COMMUNITY): Payer: Self-pay

## 2023-01-05 ENCOUNTER — Other Ambulatory Visit (HOSPITAL_COMMUNITY): Payer: Self-pay

## 2023-01-19 ENCOUNTER — Other Ambulatory Visit: Payer: Self-pay

## 2023-01-23 ENCOUNTER — Other Ambulatory Visit: Payer: Self-pay

## 2023-02-15 ENCOUNTER — Other Ambulatory Visit: Payer: Self-pay

## 2023-02-15 ENCOUNTER — Other Ambulatory Visit (HOSPITAL_COMMUNITY): Payer: Self-pay

## 2023-02-15 DIAGNOSIS — R7303 Prediabetes: Secondary | ICD-10-CM | POA: Diagnosis not present

## 2023-02-15 DIAGNOSIS — F419 Anxiety disorder, unspecified: Secondary | ICD-10-CM | POA: Diagnosis not present

## 2023-02-15 DIAGNOSIS — I10 Essential (primary) hypertension: Secondary | ICD-10-CM | POA: Diagnosis not present

## 2023-02-15 DIAGNOSIS — F320 Major depressive disorder, single episode, mild: Secondary | ICD-10-CM | POA: Diagnosis not present

## 2023-02-15 MED ORDER — ESCITALOPRAM OXALATE 10 MG PO TABS
10.0000 mg | ORAL_TABLET | Freq: Every day | ORAL | 1 refills | Status: DC
  Filled 2023-02-15: qty 90, 90d supply, fill #0
  Filled 2023-05-09 – 2023-05-18 (×2): qty 90, 90d supply, fill #1

## 2023-02-15 MED ORDER — DILTIAZEM HCL ER COATED BEADS 180 MG PO CP24
180.0000 mg | ORAL_CAPSULE | Freq: Every day | ORAL | 1 refills | Status: DC
  Filled 2023-02-15: qty 90, 90d supply, fill #0

## 2023-03-20 ENCOUNTER — Other Ambulatory Visit (HOSPITAL_COMMUNITY): Payer: Self-pay

## 2023-03-20 ENCOUNTER — Other Ambulatory Visit: Payer: Self-pay

## 2023-03-23 ENCOUNTER — Other Ambulatory Visit: Payer: Self-pay

## 2023-03-23 ENCOUNTER — Other Ambulatory Visit (HOSPITAL_COMMUNITY): Payer: Self-pay

## 2023-03-30 ENCOUNTER — Encounter (HOSPITAL_COMMUNITY): Payer: Self-pay

## 2023-03-30 ENCOUNTER — Other Ambulatory Visit (HOSPITAL_COMMUNITY): Payer: Self-pay

## 2023-03-30 MED ORDER — ZEPBOUND 2.5 MG/0.5ML ~~LOC~~ SOAJ
2.5000 mg | SUBCUTANEOUS | 0 refills | Status: DC
Start: 2023-03-30 — End: 2023-05-03
  Filled 2023-03-30 – 2023-03-31 (×3): qty 2, 28d supply, fill #0

## 2023-03-31 ENCOUNTER — Other Ambulatory Visit (HOSPITAL_COMMUNITY): Payer: Self-pay

## 2023-03-31 ENCOUNTER — Other Ambulatory Visit (HOSPITAL_BASED_OUTPATIENT_CLINIC_OR_DEPARTMENT_OTHER): Payer: Self-pay

## 2023-04-03 ENCOUNTER — Other Ambulatory Visit (HOSPITAL_COMMUNITY): Payer: Self-pay

## 2023-04-12 ENCOUNTER — Other Ambulatory Visit (HOSPITAL_COMMUNITY): Payer: Self-pay

## 2023-04-13 ENCOUNTER — Other Ambulatory Visit (HOSPITAL_COMMUNITY): Payer: Self-pay

## 2023-04-15 ENCOUNTER — Encounter (HOSPITAL_COMMUNITY): Payer: Self-pay | Admitting: *Deleted

## 2023-04-15 ENCOUNTER — Encounter: Payer: Self-pay | Admitting: Emergency Medicine

## 2023-04-15 ENCOUNTER — Other Ambulatory Visit (HOSPITAL_COMMUNITY): Payer: Self-pay

## 2023-04-15 ENCOUNTER — Other Ambulatory Visit: Payer: Self-pay

## 2023-04-15 ENCOUNTER — Emergency Department (HOSPITAL_COMMUNITY)

## 2023-04-15 ENCOUNTER — Inpatient Hospital Stay (HOSPITAL_COMMUNITY)
Admission: EM | Admit: 2023-04-15 | Discharge: 2023-04-18 | DRG: 872 | Disposition: A | Discharge status: Home / Self Care | Attending: Family Medicine | Admitting: Family Medicine

## 2023-04-15 ENCOUNTER — Ambulatory Visit
Admission: EM | Admit: 2023-04-15 | Discharge: 2023-04-15 | Disposition: A | Discharge status: Home / Self Care | Attending: Internal Medicine | Admitting: Internal Medicine

## 2023-04-15 ENCOUNTER — Ambulatory Visit

## 2023-04-15 DIAGNOSIS — R1084 Generalized abdominal pain: Secondary | ICD-10-CM | POA: Diagnosis not present

## 2023-04-15 DIAGNOSIS — R509 Fever, unspecified: Principal | ICD-10-CM

## 2023-04-15 DIAGNOSIS — F39 Unspecified mood [affective] disorder: Secondary | ICD-10-CM | POA: Diagnosis present

## 2023-04-15 DIAGNOSIS — Z83438 Family history of other disorder of lipoprotein metabolism and other lipidemia: Secondary | ICD-10-CM

## 2023-04-15 DIAGNOSIS — I10 Essential (primary) hypertension: Secondary | ICD-10-CM | POA: Diagnosis present

## 2023-04-15 DIAGNOSIS — A4189 Other specified sepsis: Principal | ICD-10-CM | POA: Diagnosis present

## 2023-04-15 DIAGNOSIS — R7989 Other specified abnormal findings of blood chemistry: Secondary | ICD-10-CM | POA: Insufficient documentation

## 2023-04-15 DIAGNOSIS — Z1152 Encounter for screening for COVID-19: Secondary | ICD-10-CM

## 2023-04-15 DIAGNOSIS — R000 Tachycardia, unspecified: Secondary | ICD-10-CM | POA: Diagnosis not present

## 2023-04-15 DIAGNOSIS — A084 Viral intestinal infection, unspecified: Secondary | ICD-10-CM | POA: Diagnosis not present

## 2023-04-15 DIAGNOSIS — A419 Sepsis, unspecified organism: Secondary | ICD-10-CM | POA: Diagnosis present

## 2023-04-15 DIAGNOSIS — R42 Dizziness and giddiness: Secondary | ICD-10-CM

## 2023-04-15 DIAGNOSIS — Z79899 Other long term (current) drug therapy: Secondary | ICD-10-CM

## 2023-04-15 DIAGNOSIS — R109 Unspecified abdominal pain: Secondary | ICD-10-CM | POA: Diagnosis not present

## 2023-04-15 DIAGNOSIS — E66813 Obesity, class 3: Secondary | ICD-10-CM | POA: Diagnosis present

## 2023-04-15 DIAGNOSIS — R197 Diarrhea, unspecified: Secondary | ICD-10-CM

## 2023-04-15 DIAGNOSIS — R7401 Elevation of levels of liver transaminase levels: Secondary | ICD-10-CM | POA: Diagnosis present

## 2023-04-15 DIAGNOSIS — Z841 Family history of disorders of kidney and ureter: Secondary | ICD-10-CM

## 2023-04-15 DIAGNOSIS — K529 Noninfective gastroenteritis and colitis, unspecified: Secondary | ICD-10-CM | POA: Diagnosis present

## 2023-04-15 DIAGNOSIS — R112 Nausea with vomiting, unspecified: Secondary | ICD-10-CM

## 2023-04-15 DIAGNOSIS — Z91030 Bee allergy status: Secondary | ICD-10-CM

## 2023-04-15 DIAGNOSIS — A0832 Astrovirus enteritis: Secondary | ICD-10-CM | POA: Insufficient documentation

## 2023-04-15 DIAGNOSIS — F32A Depression, unspecified: Secondary | ICD-10-CM | POA: Diagnosis present

## 2023-04-15 DIAGNOSIS — Z8249 Family history of ischemic heart disease and other diseases of the circulatory system: Secondary | ICD-10-CM

## 2023-04-15 DIAGNOSIS — F419 Anxiety disorder, unspecified: Secondary | ICD-10-CM | POA: Diagnosis present

## 2023-04-15 DIAGNOSIS — Z6841 Body Mass Index (BMI) 40.0 and over, adult: Secondary | ICD-10-CM

## 2023-04-15 DIAGNOSIS — K5730 Diverticulosis of large intestine without perforation or abscess without bleeding: Secondary | ICD-10-CM | POA: Diagnosis not present

## 2023-04-15 DIAGNOSIS — E876 Hypokalemia: Secondary | ICD-10-CM | POA: Diagnosis present

## 2023-04-15 DIAGNOSIS — Z7985 Long-term (current) use of injectable non-insulin antidiabetic drugs: Secondary | ICD-10-CM

## 2023-04-15 DIAGNOSIS — N179 Acute kidney failure, unspecified: Secondary | ICD-10-CM | POA: Diagnosis present

## 2023-04-15 LAB — COMPREHENSIVE METABOLIC PANEL WITH GFR
ALT: 157 U/L — ABNORMAL HIGH (ref 0–44)
AST: 144 U/L — ABNORMAL HIGH (ref 15–41)
Albumin: 3.1 g/dL — ABNORMAL LOW (ref 3.5–5.0)
Alkaline Phosphatase: 16 U/L — ABNORMAL LOW (ref 38–126)
Anion gap: 10 (ref 5–15)
BUN: 18 mg/dL (ref 6–20)
CO2: 21 mmol/L — ABNORMAL LOW (ref 22–32)
Calcium: 8.7 mg/dL — ABNORMAL LOW (ref 8.9–10.3)
Chloride: 106 mmol/L (ref 98–111)
Creatinine, Ser: 1.52 mg/dL — ABNORMAL HIGH (ref 0.61–1.24)
GFR, Estimated: >60 mL/min (ref >60)
Glucose, Bld: 127 mg/dL — ABNORMAL HIGH (ref 70–99)
Potassium: 3.7 mmol/L (ref 3.5–5.1)
Sodium: 137 mmol/L (ref 135–145)
Total Bilirubin: 0.6 mg/dL (ref 0.0–1.2)
Total Protein: 6.7 g/dL (ref 6.5–8.1)

## 2023-04-15 LAB — URINALYSIS, ROUTINE W REFLEX MICROSCOPIC
Bilirubin Urine: NEGATIVE
Glucose, UA: NEGATIVE mg/dL
Hgb urine dipstick: NEGATIVE
Ketones, ur: NEGATIVE mg/dL
Leukocytes,Ua: NEGATIVE
Nitrite: NEGATIVE
Protein, ur: 100 mg/dL — AB
Specific Gravity, Urine: 1.038 — ABNORMAL HIGH (ref 1.005–1.030)
pH: 5 (ref 5.0–8.0)

## 2023-04-15 LAB — POC COVID19/FLU A&B COMBO
Covid Antigen, POC: NEGATIVE
Influenza A Antigen, POC: NEGATIVE
Influenza B Antigen, POC: NEGATIVE

## 2023-04-15 LAB — CBC
HCT: 43.8 % (ref 39.0–52.0)
Hemoglobin: 13.6 g/dL (ref 13.0–17.0)
MCH: 26.3 pg (ref 26.0–34.0)
MCHC: 31.1 g/dL (ref 30.0–36.0)
MCV: 84.7 fL (ref 80.0–100.0)
Platelets: 189 10*3/uL (ref 150–400)
RBC: 5.17 MIL/uL (ref 4.22–5.81)
RDW: 15 % (ref 11.5–15.5)
WBC: 10.3 10*3/uL (ref 4.0–10.5)
nRBC: 0 % (ref 0.0–0.2)

## 2023-04-15 LAB — LIPASE, BLOOD: Lipase: 31 U/L (ref 11–51)

## 2023-04-15 LAB — POCT FASTING CBG KUC MANUAL ENTRY: POCT Glucose (KUC): 108 mg/dL — AB (ref 70–99)

## 2023-04-15 MED ORDER — ONDANSETRON 4 MG PO TBDP
4.0000 mg | ORAL_TABLET | Freq: Three times a day (TID) | ORAL | 0 refills | Status: DC | PRN
  Filled 2023-04-15 (×2): qty 20, 7d supply, fill #0

## 2023-04-15 MED ORDER — IOHEXOL 350 MG/ML SOLN
75.0000 mL | Freq: Once | INTRAVENOUS | Status: AC | PRN
  Administered 2023-04-15: 75 mL via INTRAVENOUS

## 2023-04-15 MED ORDER — ONDANSETRON HCL 4 MG/2ML IJ SOLN
4.0000 mg | Freq: Once | INTRAMUSCULAR | Status: AC
  Administered 2023-04-15: 4 mg via INTRAVENOUS
  Filled 2023-04-15: qty 2

## 2023-04-15 MED ORDER — SODIUM CHLORIDE 0.9 % IV BOLUS
1000.0000 mL | Freq: Once | INTRAVENOUS | Status: AC
  Administered 2023-04-15: 1000 mL via INTRAVENOUS

## 2023-04-15 MED ORDER — SODIUM CHLORIDE 0.9 % IV BOLUS
1000.0000 mL | Freq: Once | INTRAVENOUS | Status: AC
  Administered 2023-04-16: 1000 mL via INTRAVENOUS

## 2023-04-15 MED ORDER — ONDANSETRON 4 MG PO TBDP
4.0000 mg | ORAL_TABLET | Freq: Once | ORAL | Status: AC
  Administered 2023-04-15: 4 mg via ORAL

## 2023-04-15 MED ORDER — METRONIDAZOLE 500 MG PO TABS
500.0000 mg | ORAL_TABLET | Freq: Two times a day (BID) | ORAL | Status: DC
  Administered 2023-04-16 – 2023-04-17 (×3): 500 mg via ORAL
  Filled 2023-04-15 (×3): qty 1

## 2023-04-15 MED ORDER — SODIUM CHLORIDE 0.9 % IV SOLN: INTRAVENOUS | Status: DC

## 2023-04-15 MED ORDER — KETOROLAC TROMETHAMINE 30 MG/ML IJ SOLN
30.0000 mg | Freq: Once | INTRAMUSCULAR | Status: AC
  Administered 2023-04-15: 30 mg via INTRAMUSCULAR

## 2023-04-15 MED ORDER — SODIUM CHLORIDE 0.9 % IV SOLN: Freq: Once | INTRAVENOUS | Status: DC

## 2023-04-15 MED ORDER — SODIUM CHLORIDE 0.9 % IV SOLN
2.0000 g | INTRAVENOUS | Status: DC
  Administered 2023-04-16 – 2023-04-17 (×2): 2 g via INTRAVENOUS
  Filled 2023-04-15 (×2): qty 20

## 2023-04-15 MED ORDER — MECLIZINE HCL 12.5 MG PO TABS
12.5000 mg | ORAL_TABLET | Freq: Three times a day (TID) | ORAL | 0 refills | Status: DC | PRN
  Filled 2023-04-15 (×2): qty 20, 7d supply, fill #0

## 2023-04-15 MED ORDER — ACETAMINOPHEN 500 MG PO TABS
1000.0000 mg | ORAL_TABLET | Freq: Once | ORAL | Status: AC
  Administered 2023-04-15: 1000 mg via ORAL
  Filled 2023-04-15: qty 2

## 2023-04-15 NOTE — ED Provider Notes (Signed)
 [facility]    CSN: [id number] Arrival date & time: 04/15/23  1114      History   Chief Complaint Chief Complaint  Patient presents with   Nausea   Emesis   Diarrhea    HPI Lee Jones is a 28 y.o. male.   Lee Jones is a 28 y.o. male presenting for chief complaint of nausea and diarrhea that started 3 days ago.  He has been routinely taking Zofran and has not had any episodes of emesis.  No blood or mucus to the stools.  Stools have been loose for the last 3 days with associated generalized bilateral upper and lower abdominal pain.  Pain is described as a "churning sensation" to the right upper quadrant abdomen and a burning sensation to the left upper abdomen.  Reports cramping sensation to the bilateral lower abdomen.  Temperature after taking Tylenol and Motrin last night was 9.9.  Currently with low-grade fever at 99.6 in the urgent care.  Reports dizziness that he describes as a vertigo/room spinning sensation and is worsened upon standing. He has not had anything to eat this morning and is not a diabetic. Denies cough, congestion, ear pain, flank pain, urinary symptoms, and gross hematuria. Denies recent changes in exercise habits or previous abdominal surgeries. No recent antibiotics or recent travel outside of the country. 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 Taking motrin, tylenol, and Zofran with some relief. No recent NSAIDs in the last 8 hours.    Emesis Associated symptoms: diarrhea   Diarrhea Associated symptoms: vomiting     Past Medical History:  Diagnosis Date   Anxiety    Depression    Hypertension     There are no active problems to display for this patient.   History reviewed. No pertinent surgical history.     Home Medications    Prior to Admission medications   Medication Sig Start Date End Date Taking? Authorizing Provider  meclizine  (ANTIVERT) 12.5 MG tablet Take 1 tablet (12.5 mg total) by mouth 3 (three) times daily as needed for dizziness. 04/15/23  Yes Carlisle Beers, FNP  ondansetron (ZOFRAN-ODT) 4 MG disintegrating tablet Take 1 tablet (4 mg total) by mouth every 8 (eight) hours as needed for nausea or vomiting. 04/15/23  Yes Carlisle Beers, FNP  amLODipine (NORVASC) 10 MG tablet Take 1 tablet (10 mg total) by mouth once daily 02/26/21     amLODipine (NORVASC) 5 MG tablet Take 1 tablet (5 mg total) by mouth once daily 02/25/21     amoxicillin-clavulanate (AUGMENTIN) 875-125 MG tablet Take 1 tablet by mouth every 12 (twelve) hours for 7 days 06/27/22     amoxicillin-clavulanate (AUGMENTIN) 875-125 MG tablet Take 1 tablet by mouth every 12 (twelve) hours for 10 days 10/17/22     ciprofloxacin (CIPRO) 500 MG tablet Take 1 tablet (500 mg total) by mouth 2 (two) times daily for 10 days 12/17/21     diltiazem (CARDIZEM CD) 120 MG 24 hr capsule Take 1 capsule (120 mg total) by mouth daily. 10/24/22     diltiazem (CARDIZEM CD) 180 MG 24 hr capsule Take 1 capsule (180 mg total) by mouth daily. 02/15/23     diltiazem (CARDIZEM CD) 240 MG 24 hr capsule Take 1 capsule (240 mg total) by mouth once daily 04/05/21     diltiazem (CARDIZEM CD) 240 MG 24 hr capsule Take 1 capsule (240  mg total) by mouth once daily for 90 days 08/04/21     EPINEPHrine 0.3 mg/0.3 mL IJ SOAJ injection Inject 0.3 mLs (0.3 mg total) into the muscle once as needed for Anaphylaxis for up to 1 dose 09/28/21     EPINEPHrine 0.3 mg/0.3 mL IJ SOAJ injection Inject 0.3 mg into the muscle as needed. 11/29/21     escitalopram (LEXAPRO) 10 MG tablet Take 1 tablet (10 mg total) by mouth daily. 02/15/23     escitalopram (LEXAPRO) 5 MG tablet Take 1 tablet (5 mg total) by mouth once daily 03/03/21     escitalopram (LEXAPRO) 5 MG tablet Take 1 tablet (5 mg total) by mouth once daily for 90 days 06/22/21     escitalopram (LEXAPRO) 5 MG tablet Take 1 tablet (5 mg total) by mouth  daily. 02/14/22     escitalopram (LEXAPRO) 5 MG tablet Take 1 tablet (5 mg total) by mouth daily. 03/29/22     escitalopram (LEXAPRO) 5 MG tablet Take 1 tablet (5 mg total) by mouth daily. 05/12/22     hydrochlorothiazide (HYDRODIURIL) 12.5 MG tablet Take 1 tablet (12.5 mg total) by mouth once daily 03/12/21     losartan (COZAAR) 100 MG tablet Take 1 tablet (100 mg total) by mouth once daily 02/26/21     losartan (COZAAR) 100 MG tablet Take 1 tablet (100 mg total) by mouth once daily 08/04/21     losartan (COZAAR) 100 MG tablet Take 1 tablet (100 mg total) by mouth daily. 11/29/21     losartan (COZAAR) 100 MG tablet Take 1 tablet (100 mg total) by mouth daily. 03/29/22     losartan (COZAAR) 100 MG tablet Take 1 tablet (100 mg total) by mouth daily. 06/27/22     losartan (COZAAR) 100 MG tablet Take 1 tablet (100 mg total) by mouth daily. 12/26/22     losartan (COZAAR) 50 MG tablet Take 1 tablet (50 mg total) by mouth once daily 12/04/20     tirzepatide (ZEPBOUND) 2.5 MG/0.5ML Pen Inject 2.5 mg into the skin once a week. 03/30/23     tiZANidine (ZANAFLEX) 2 MG tablet Take 1 tablet (2 mg total) by mouth at bedtime as needed for up to 30 days 03/04/21     telmisartan (MICARDIS) 40 MG tablet Take 1 tablet (40 mg total) by mouth once daily for 90 days 08/04/21 08/04/21      Family History Family History  Problem Relation Age of Onset   Chronic Renal Failure Mother    High Cholesterol Mother    Hyperlipidemia Father    Heart failure Father     Social History Social History   Tobacco Use   Smoking status: Never   Smokeless tobacco: Never  Vaping Use   Vaping status: Never Used  Substance Use Topics   Alcohol use: Yes    Comment: occasionally   Drug use: Never     Allergies   Patient has no known allergies.   Review of Systems Review of Systems  Gastrointestinal:  Positive for diarrhea and vomiting.  Per HPI   Physical Exam Triage Vital Signs ED Triage Vitals [04/15/23 1120]  Encounter  Vitals Group     BP (!) 156/84     Systolic BP Percentile      Diastolic BP Percentile      Pulse Rate (!) 113     Resp 17     Temp 98 F (36.7 C)     Temp Source Oral  SpO2 98 %     Weight      Height      Head Circumference      Peak Flow      Pain Score      Pain Loc      Pain Education      Exclude from Growth Chart    No data found.  Updated Vital Signs BP (!) 161/105 (BP Location: Right Wrist)   Pulse 97   Temp 98 F (36.7 C) (Oral)   Resp 17   SpO2 96%   Visual Acuity Right Eye Distance:   Left Eye Distance:   Bilateral Distance:    Right Eye Near:   Left Eye Near:    Bilateral Near:     Physical Exam Vitals and nursing note reviewed.  Constitutional:      Appearance: He is ill-appearing. He is not toxic-appearing.  HENT:     Head: Normocephalic and atraumatic.     Right Ear: Hearing, tympanic membrane, ear canal and external ear normal.     Left Ear: Hearing, tympanic membrane, ear canal and external ear normal.     Nose: Nose normal.     Mouth/Throat:     Lips: Pink.     Mouth: Mucous membranes are moist. No injury or oral lesions.     Dentition: Normal dentition.     Tongue: No lesions.     Pharynx: Oropharynx is clear. Uvula midline. No pharyngeal swelling, oropharyngeal exudate, posterior oropharyngeal erythema, uvula swelling or postnasal drip.     Tonsils: No tonsillar exudate.  Eyes:     General: Lids are normal. Vision grossly intact. Gaze aligned appropriately.        Right eye: No discharge.        Left eye: No discharge.     Extraocular Movements: Extraocular movements intact.     Conjunctiva/sclera: Conjunctivae normal.     Pupils: Pupils are equal, round, and reactive to light.  Neck:     Trachea: Trachea and phonation normal.  Cardiovascular:     Rate and Rhythm: Normal rate and regular rhythm.     Heart sounds: Normal heart sounds, S1 normal and S2 normal.  Pulmonary:     Effort: Pulmonary effort is normal. No respiratory  distress.     Breath sounds: Normal breath sounds and air entry. No wheezing, rhonchi or rales.  Chest:     Chest wall: No tenderness.  Abdominal:     General: Abdomen is flat. Bowel sounds are normal.     Palpations: Abdomen is soft.     Tenderness: There is abdominal tenderness. There is no right CVA tenderness, left CVA tenderness, guarding or rebound. Negative signs include Murphy's sign, Rovsing's sign and McBurney's sign.  Musculoskeletal:     Cervical back: Neck supple.  Lymphadenopathy:     Cervical: No cervical adenopathy.  Skin:    General: Skin is warm and dry.     Capillary Refill: Capillary refill takes less than 2 seconds.     Findings: No rash.  Neurological:     General: No focal deficit present.     Mental Status: He is alert and oriented to person, place, and time. Mental status is at baseline.     GCS: GCS eye subscore is 4. GCS verbal subscore is 5. GCS motor subscore is 6.     Cranial Nerves: Cranial nerves 2-12 are intact. No dysarthria or facial asymmetry.     Sensory: Sensation is intact.  Motor: Motor function is intact. No weakness, tremor, abnormal muscle tone or pronator drift.     Coordination: Coordination is intact. Romberg sign negative. Coordination normal. Finger-Nose-Finger Test normal.     Gait: Gait is intact.     Comments: Moves all 4 extremities with normal coordination voluntarily. Non-focal neuro exam.   Psychiatric:        Mood and Affect: Mood normal.        Speech: Speech normal.        Behavior: Behavior normal.        Thought Content: Thought content normal.        Judgment: Judgment normal.      UC Treatments / Results  Labs (all labs ordered are listed, but only abnormal results are displayed) Labs Reviewed  POCT FASTING CBG [facility] MANUAL ENTRY - Abnormal; Notable for the following components:      Result Value   POCT Glucose ([facility]) 108 (*)    All other components within normal limits  POC COVID19/FLU A&B COMBO - Normal  CBC   COMPREHENSIVE METABOLIC PANEL WITH GFR  LIPASE    EKG   Radiology No results found.  Procedures Procedures (including critical care time)  Medications Ordered in UC Medications  ondansetron (ZOFRAN-ODT) disintegrating tablet 4 mg (4 mg Oral Given 04/15/23 1208)  ketorolac (TORADOL) 30 MG/ML injection 30 mg (30 mg Intramuscular Given 04/15/23 1208)    Initial Impression / Assessment and Plan / UC Course  I have reviewed the triage vital signs and the nursing notes.  Pertinent labs & imaging results that were available during my care of the patient were reviewed by me and considered in my medical decision making (see chart for details).   1. Generalized abdominal pain, viral gastroenteritis, dizziness Evaluation suggests viral gastrointestinal illness etiology.  COVID and flu POC negative.  Patient nontoxic appearing with hemodynamically stable vital signs, abdominal exam without peritoneal signs/focal tenderness. Neuroexam nonfocal. Low suspicion for intracranial abnormality causing dizziness. Orthostatic vital signs unremarkable. Suspect dehydration is contributing to dizziness with position changes. Will trial use of meclizine as needed for dizziness should there be a vertigo component to this.  Drowsiness precautions discussed regarding meclizine. Will manage this with antiemetic (Zofran) as needed, OTC medicines as needed for discomfort/pain, increased fluids, and rest.  Ketorolac 30 mg IM and Zofran 4 mg ODT given in clinic with some relief of nausea and bodyaches.  No NSAIDs for 24 hours due to ketorolac. Liquid/bland diet initially, then increase diet as tolerated.   No clinical indication for IV rehydration today as orthostatic vital signs are unremarkable and patient is tolerating p.o. fluids without vomiting.  Basic blood work drawn to evaluate for organ dysfunction/electrolyte imbalance and hypokalemia secondary todiarrhea. Staff will call if blood work is abnormal.    Counseled patient on potential for adverse effects with medications prescribed/recommended today, strict ER and return-to-clinic precautions discussed, patient verbalized understanding.    Final Clinical Impressions(s) / UC Diagnoses   Final diagnoses:  Generalized abdominal pain  Viral gastroenteritis  Dizziness     Discharge Instructions      Your evaluation suggests that your symptoms are most likely due to viral stomach illness (gastroenteritis/"stomach bug") which will improve on its own with rest and fluids in the next few days.   I have drawn blood work today to evaluate for infection, electrolyte/organ abnormalities. Staff will call if blood work is abnormal.   We gave you a shot of ketorolac today. Do not take ibuprofen for  the next 24 hours due to ketorolac. You may take tylenol as needed.  Take zofran to help with nausea every 8 hours as needed. You may use over the counter medicines for aches and pains such as tylenol as needed.  Start sipping on liquids (broth, water, gatorade, etc). If you are able to keep liquids down without vomiting for 1-2 hours, you may eat bland foods like jello, pudding, applesauce, bananas, rice, and white toast. Once you can tolerate blands, you may return to normal diet.   Pedialyte or gatorolyte may help to prevent/fix dehydration due to vomiting and diarrhea.  Please follow up with your primary care provider for further management. Return if you experience worsening or uncontrolled pain, inability to tolerate fluids by mouth, difficulty breathing, fevers 100.11F or greater, recurrent vomiting, or any other concerning symptoms.     ED Prescriptions     Medication Sig Dispense Auth. Provider   ondansetron (ZOFRAN-ODT) 4 MG disintegrating tablet Take 1 tablet (4 mg total) by mouth every 8 (eight) hours as needed for nausea or vomiting. 20 tablet Reita May M, FNP   meclizine (ANTIVERT) 12.5 MG tablet Take 1 tablet (12.5 mg  total) by mouth 3 (three) times daily as needed for dizziness. 20 tablet Carlisle Beers, FNP      PDMP not reviewed this encounter.   Carlisle Beers, [place] 04/15/23 1256

## 2023-04-15 NOTE — Discharge Instructions (Addendum)
 Your evaluation suggests that your symptoms are most likely due to viral stomach illness (gastroenteritis/"stomach bug") which will improve on its own with rest and fluids in the next few days.   I have drawn blood work today to evaluate for infection, electrolyte/organ abnormalities. Staff will call if blood work is abnormal.   We gave you a shot of ketorolac today. Do not take ibuprofen for the next 24 hours due to ketorolac. You may take tylenol as needed.  Take zofran to help with nausea every 8 hours as needed. You may use over the counter medicines for aches and pains such as tylenol as needed.  Start sipping on liquids (broth, water, gatorade, etc). If you are able to keep liquids down without vomiting for 1-2 hours, you may eat bland foods like jello, pudding, applesauce, bananas, rice, and white toast. Once you can tolerate blands, you may return to normal diet.   Pedialyte or gatorolyte may help to prevent/fix dehydration due to vomiting and diarrhea.  Please follow up with your primary care provider for further management. Return if you experience worsening or uncontrolled pain, inability to tolerate fluids by mouth, difficulty breathing, fevers 100.50F or greater, recurrent vomiting, or any other concerning symptoms.

## 2023-04-15 NOTE — ED Triage Notes (Addendum)
 Pt c/o nausea vomitting, diarrhea, fever for 1 day. He also has had blurry vision and vertigo.  He took Tylenol, ibuprofen and Zofran last night

## 2023-04-15 NOTE — ED Provider Notes (Signed)
 [place] EMERGENCY DEPARTMENT AT [facility] Provider Note   CSN: [id number] Arrival date & time: 04/15/23  2009     History {Add pertinent medical, surgical, social history, OB history to HPI:1} Chief Complaint  Patient presents with   Abdominal Pain    Lee Jones is a 28 y.o. male.  28 year old male with prior medical history as detailed below presents for evaluation.  Patient reports approximately 3 days of nausea, vomiting, diarrhea, diffuse achy pain.  He reports high temperatures at home.  He is noted to be febrile on arrival with a temperature of 103.  Patient reports that he has had significant problems keeping anything down for the last 24 hours.  He denies any bloody emesis or bloody stool.  The history is provided by the patient.       Home Medications Prior to Admission medications   Medication Sig Start Date End Date Taking? Authorizing Provider  amLODipine (NORVASC) 10 MG tablet Take 1 tablet (10 mg total) by mouth once daily 02/26/21     amLODipine (NORVASC) 5 MG tablet Take 1 tablet (5 mg total) by mouth once daily 02/25/21     amoxicillin-clavulanate (AUGMENTIN) 875-125 MG tablet Take 1 tablet by mouth every 12 (twelve) hours for 7 days 06/27/22     amoxicillin-clavulanate (AUGMENTIN) 875-125 MG tablet Take 1 tablet by mouth every 12 (twelve) hours for 10 days 10/17/22     ciprofloxacin (CIPRO) 500 MG tablet Take 1 tablet (500 mg total) by mouth 2 (two) times daily for 10 days 12/17/21     diltiazem (CARDIZEM CD) 120 MG 24 hr capsule Take 1 capsule (120 mg total) by mouth daily. 10/24/22     diltiazem (CARDIZEM CD) 180 MG 24 hr capsule Take 1 capsule (180 mg total) by mouth daily. 02/15/23     diltiazem (CARDIZEM CD) 240 MG 24 hr capsule Take 1 capsule (240 mg total) by mouth once daily 04/05/21     diltiazem (CARDIZEM CD) 240 MG 24 hr capsule Take 1 capsule (240 mg total) by mouth once daily for 90 days 08/04/21     EPINEPHrine 0.3 mg/0.3 mL IJ SOAJ  injection Inject 0.3 mLs (0.3 mg total) into the muscle once as needed for Anaphylaxis for up to 1 dose 09/28/21     EPINEPHrine 0.3 mg/0.3 mL IJ SOAJ injection Inject 0.3 mg into the muscle as needed. 11/29/21     escitalopram (LEXAPRO) 10 MG tablet Take 1 tablet (10 mg total) by mouth daily. 02/15/23     escitalopram (LEXAPRO) 5 MG tablet Take 1 tablet (5 mg total) by mouth once daily 03/03/21     escitalopram (LEXAPRO) 5 MG tablet Take 1 tablet (5 mg total) by mouth once daily for 90 days 06/22/21     escitalopram (LEXAPRO) 5 MG tablet Take 1 tablet (5 mg total) by mouth daily. 02/14/22     escitalopram (LEXAPRO) 5 MG tablet Take 1 tablet (5 mg total) by mouth daily. 03/29/22     escitalopram (LEXAPRO) 5 MG tablet Take 1 tablet (5 mg total) by mouth daily. 05/12/22     hydrochlorothiazide (HYDRODIURIL) 12.5 MG tablet Take 1 tablet (12.5 mg total) by mouth once daily 03/12/21     losartan (COZAAR) 100 MG tablet Take 1 tablet (100 mg total) by mouth once daily 02/26/21     losartan (COZAAR) 100 MG tablet Take 1 tablet (100 mg total) by mouth once daily 08/04/21     losartan (COZAAR) 100 MG tablet Take  1 tablet (100 mg total) by mouth daily. 11/29/21     losartan (COZAAR) 100 MG tablet Take 1 tablet (100 mg total) by mouth daily. 03/29/22     losartan (COZAAR) 100 MG tablet Take 1 tablet (100 mg total) by mouth daily. 06/27/22     losartan (COZAAR) 100 MG tablet Take 1 tablet (100 mg total) by mouth daily. 12/26/22     losartan (COZAAR) 50 MG tablet Take 1 tablet (50 mg total) by mouth once daily 12/04/20     meclizine (ANTIVERT) 12.5 MG tablet Take 1 tablet (12.5 mg total) by mouth 3 (three) times daily as needed for dizziness. 04/15/23   Carlisle Beers, FNP  ondansetron (ZOFRAN-ODT) 4 MG disintegrating tablet Take 1 tablet (4 mg total) by mouth every 8 (eight) hours as needed for nausea or vomiting. 04/15/23   Carlisle Beers, FNP  tirzepatide (ZEPBOUND) 2.5 MG/0.5ML Pen Inject 2.5 mg into the skin  once a week. 03/30/23     tiZANidine (ZANAFLEX) 2 MG tablet Take 1 tablet (2 mg total) by mouth at bedtime as needed for up to 30 days 03/04/21     telmisartan (MICARDIS) 40 MG tablet Take 1 tablet (40 mg total) by mouth once daily for 90 days 08/04/21 08/04/21        Allergies    Patient has no known allergies.    Review of Systems   Review of Systems  All other systems reviewed and are negative.   Physical Exam Updated Vital Signs BP (!) 180/115   Pulse (!) 121   Temp (!) 103.2 F (39.6 C)   Resp (!) 32   Ht 5\' 9"  (1.753 m)   Wt (!) 145.2 kg   SpO2 97%   BMI 47.27 kg/m  Physical Exam Vitals and nursing note reviewed.  Constitutional:      General: He is not in acute distress.    Appearance: Normal appearance. He is well-developed.  HENT:     Head: Normocephalic and atraumatic.  Eyes:     Conjunctiva/sclera: Conjunctivae normal.     Pupils: Pupils are equal, round, and reactive to light.  Cardiovascular:     Rate and Rhythm: Regular rhythm. Tachycardia present.     Heart sounds: Normal heart sounds.  Pulmonary:     Effort: Pulmonary effort is normal. No respiratory distress.     Breath sounds: Normal breath sounds.  Abdominal:     General: There is no distension.     Palpations: Abdomen is soft.     Tenderness: There is no abdominal tenderness.  Musculoskeletal:        General: No deformity. Normal range of motion.     Cervical back: Normal range of motion and neck supple.  Skin:    General: Skin is warm and dry.  Neurological:     General: No focal deficit present.     Mental Status: He is alert and oriented to person, place, and time.     ED Results / Procedures / Treatments   Labs (all labs ordered are listed, but only abnormal results are displayed) Labs Reviewed  COMPREHENSIVE METABOLIC PANEL WITH GFR - Abnormal; Notable for the following components:      Result Value   CO2 21 (*)    Glucose, Bld 127 (*)    Creatinine, Ser 1.52 (*)    Calcium 8.7 (*)     Albumin 3.1 (*)    AST 144 (*)    ALT 157 (*)  Alkaline Phosphatase 16 (*)    All other components within normal limits  URINALYSIS, ROUTINE W REFLEX MICROSCOPIC - Abnormal; Notable for the following components:   Color, Urine AMBER (*)    APPearance HAZY (*)    Specific Gravity, Urine 1.038 (*)    Protein, ur 100 (*)    Bacteria, UA FEW (*)    All other components within normal limits  LIPASE, BLOOD  CBC  HEPATITIS PANEL, ACUTE    EKG EKG Interpretation Date/Time:  Saturday April 15 2023 21:14:55 EDT Ventricular Rate:  125 PR Interval:  143 QRS Duration:  84 QT Interval:  273 QTC Calculation: 394 R Axis:   10  Text Interpretation: Sinus tachycardia Borderline ST elevation, anterior leads Baseline wander in lead(s) V1 Confirmed by Kristine Royal [phone]) on 04/15/2023 9:26:20 PM  Radiology CT ABDOMEN PELVIS W CONTRAST Result Date: 04/15/2023 CLINICAL DATA:  Acute nonlocalized abdominal pain. Nausea, vomiting, diarrhea EXAM: CT ABDOMEN AND PELVIS WITH CONTRAST TECHNIQUE: Multidetector CT imaging of the abdomen and pelvis was performed using the standard protocol following bolus administration of intravenous contrast. RADIATION DOSE REDUCTION: This exam was performed according to the departmental dose-optimization program which includes automated exposure control, adjustment of the mA and/or kV according to patient size and/or use of iterative reconstruction technique. CONTRAST:  75mL OMNIPAQUE IOHEXOL 350 MG/ML SOLN COMPARISON:  None Available. FINDINGS: Lower chest: No acute abnormality. Hepatobiliary: No focal liver abnormality is seen. No gallstones, gallbladder wall thickening, or biliary dilatation. Pancreas: Unremarkable Spleen: Unremarkable Adrenals/Urinary Tract: Adrenal glands are unremarkable. Kidneys are normal, without renal calculi, focal lesion, or hydronephrosis. Bladder is unremarkable. Stomach/Bowel: Mild sigmoid diverticulosis. Stomach, small bowel, and large bowel  are otherwise unremarkable. Appendix normal. No evidence of obstruction or focal inflammation. No free intraperitoneal gas or fluid. Vascular/Lymphatic: Choose wound Reproductive: Prostate is unremarkable. Other: No abdominal wall hernia or abnormality. No abdominopelvic ascites. Musculoskeletal: No acute or significant osseous findings. IMPRESSION: 1. No acute intra-abdominal pathology identified. No definite radiographic explanation for the patient's reported symptoms. 2. Mild sigmoid diverticulosis without superimposed acute inflammatory change. Electronically Signed   By: Helyn Numbers M.D.   On: 04/15/2023 22:27    Procedures Procedures  {Document cardiac monitor, telemetry assessment procedure when appropriate:1}  Medications Ordered in ED Medications  0.9 %  sodium chloride infusion (has no administration in time range)  sodium chloride 0.9 % bolus 1,000 mL (1,000 mLs Intravenous New Bag/Given 04/15/23 2143)  ondansetron (ZOFRAN) injection 4 mg (4 mg Intravenous Given 04/15/23 2140)  acetaminophen (TYLENOL) tablet 1,000 mg (1,000 mg Oral Given 04/15/23 2140)  iohexol (OMNIPAQUE) 350 MG/ML injection 75 mL (75 mLs Intravenous Contrast Given 04/15/23 2213)    ED Course/ Medical Decision Making/ A&P   {   Click here for ABCD2, HEART and other calculatorsREFRESH Note before signing :1}                              Medical Decision Making Amount and/or Complexity of Data Reviewed Labs: ordered. Radiology: ordered.  Risk OTC drugs. Prescription drug management. Decision regarding hospitalization.    Medical Screen Complete  This patient presented to the ED with complaint of nausea, vomiting, diarrhea, fever.  This complaint involves an extensive number of treatment options. The initial differential diagnosis includes, but is not limited to, viral versus bacterial infection, metabolic abnormality, intra-abdominal pathology such as colitis, etc.  This presentation is: Acute,  Self-Limited, Previously Undiagnosed, Uncertain Prognosis, Complicated, Systemic  Symptoms, and Threat to Life/Bodily Function  Patient is presenting with reported high fevers and associated nausea, vomiting, diarrhea x 3 days.  Patient appears to be dehydrated on exam.  Patient is noted to be tachycardic into the 140s on the initial evaluation.  He is also febrile to 103.  Notable lab abnormalities include creatinine of 1.5.  AST is 144.  ALT is 157.  Alk phos is 16.  Total bili 0.6.    Co morbidities that complicated the patient's evaluation  ***   Additional history obtained:  Additional history obtained from {History source:19196::"EMS","Spouse","Family","Friend","Caregiver"} External records from outside sources obtained and reviewed including prior ED visits and prior Inpatient records.    Lab Tests:  I ordered and personally interpreted labs.  The pertinent results include:  ***   Imaging Studies ordered:  I ordered imaging studies including ***  I independently visualized and interpreted obtained imaging which showed *** I agree with the radiologist interpretation.   Cardiac Monitoring:  The patient was maintained on a cardiac monitor.  I personally viewed and interpreted the cardiac monitor which showed an underlying rhythm of: ***   Medicines ordered:  I ordered medication including ***  for ***  Reevaluation of the patient after these medicines showed that the patient: {resolved/improved/worsened:23923::"improved"}    Test Considered:  ***   Critical Interventions:  ***   Consultations Obtained:  I consulted ***,  and discussed lab and imaging findings as well as pertinent plan of care.    Problem List / ED Course:  ***   Reevaluation:  After the interventions noted above, I reevaluated the patient and found that they have: {resolved/improved/worsened:23923::"improved"}   Social Determinants of Health:  ***   Disposition:  After  consideration of the diagnostic results and the patients response to treatment, I feel that the patent would benefit from ***.    {Document critical care time when appropriate:1} {Document review of labs and clinical decision tools ie heart score, Chads2Vasc2 etc:1}  {Document your independent review of radiology images, and any outside records:1} {Document your discussion with family members, caretakers, and with consultants:1} {Document social determinants of health affecting pt's care:1} {Document your decision making why or why not admission, treatments were needed:1} Final Clinical Impression(s) / ED Diagnoses Final diagnoses:  Fever, unspecified fever cause  Nausea and vomiting, unspecified vomiting type  Diarrhea, unspecified type  Transaminitis    Rx / DC Orders ED Discharge Orders     None

## 2023-04-15 NOTE — ED Triage Notes (Signed)
 The pt has had body aches n and v with an elevated temp with vertigo since Thursday  he has had neg covids and flu  he was seen at an urgent care around 1200n and treated for the symptoms he does not feel any better

## 2023-04-15 NOTE — ED Notes (Signed)
 Patient transported to CT

## 2023-04-16 DIAGNOSIS — K529 Noninfective gastroenteritis and colitis, unspecified: Secondary | ICD-10-CM | POA: Diagnosis not present

## 2023-04-16 DIAGNOSIS — Z1152 Encounter for screening for COVID-19: Secondary | ICD-10-CM | POA: Diagnosis not present

## 2023-04-16 DIAGNOSIS — A084 Viral intestinal infection, unspecified: Secondary | ICD-10-CM | POA: Diagnosis present

## 2023-04-16 DIAGNOSIS — Z83438 Family history of other disorder of lipoprotein metabolism and other lipidemia: Secondary | ICD-10-CM | POA: Diagnosis not present

## 2023-04-16 DIAGNOSIS — E66813 Obesity, class 3: Secondary | ICD-10-CM | POA: Diagnosis not present

## 2023-04-16 DIAGNOSIS — F419 Anxiety disorder, unspecified: Secondary | ICD-10-CM | POA: Diagnosis present

## 2023-04-16 DIAGNOSIS — Z7985 Long-term (current) use of injectable non-insulin antidiabetic drugs: Secondary | ICD-10-CM | POA: Diagnosis not present

## 2023-04-16 DIAGNOSIS — A4189 Other specified sepsis: Secondary | ICD-10-CM | POA: Diagnosis not present

## 2023-04-16 DIAGNOSIS — N179 Acute kidney failure, unspecified: Secondary | ICD-10-CM | POA: Diagnosis not present

## 2023-04-16 DIAGNOSIS — F39 Unspecified mood [affective] disorder: Secondary | ICD-10-CM | POA: Diagnosis not present

## 2023-04-16 DIAGNOSIS — E876 Hypokalemia: Secondary | ICD-10-CM | POA: Diagnosis not present

## 2023-04-16 DIAGNOSIS — A419 Sepsis, unspecified organism: Secondary | ICD-10-CM | POA: Diagnosis present

## 2023-04-16 DIAGNOSIS — I10 Essential (primary) hypertension: Secondary | ICD-10-CM | POA: Diagnosis not present

## 2023-04-16 DIAGNOSIS — Z8249 Family history of ischemic heart disease and other diseases of the circulatory system: Secondary | ICD-10-CM | POA: Diagnosis not present

## 2023-04-16 DIAGNOSIS — Z6841 Body Mass Index (BMI) 40.0 and over, adult: Secondary | ICD-10-CM | POA: Diagnosis not present

## 2023-04-16 DIAGNOSIS — Z841 Family history of disorders of kidney and ureter: Secondary | ICD-10-CM | POA: Diagnosis not present

## 2023-04-16 DIAGNOSIS — Z91030 Bee allergy status: Secondary | ICD-10-CM | POA: Diagnosis not present

## 2023-04-16 DIAGNOSIS — F32A Depression, unspecified: Secondary | ICD-10-CM | POA: Diagnosis not present

## 2023-04-16 DIAGNOSIS — R109 Unspecified abdominal pain: Secondary | ICD-10-CM | POA: Diagnosis present

## 2023-04-16 DIAGNOSIS — Z79899 Other long term (current) drug therapy: Secondary | ICD-10-CM | POA: Diagnosis not present

## 2023-04-16 DIAGNOSIS — R7401 Elevation of levels of liver transaminase levels: Secondary | ICD-10-CM | POA: Diagnosis present

## 2023-04-16 LAB — RESPIRATORY PANEL BY PCR

## 2023-04-16 LAB — CBC
HCT: 39.5 % (ref 39.0–52.0)
Hematocrit: 43.5 % (ref 37.5–51.0)
Hemoglobin: 13.8 g/dL (ref 13.0–17.7)
Hemoglobin: 12.4 g/dL — ABNORMAL LOW (ref 13.0–17.0)
MCH: 26 pg — ABNORMAL LOW (ref 26.6–33.0)
MCH: 26.3 pg (ref 26.0–34.0)
MCHC: 31.7 g/dL (ref 31.5–35.7)
MCHC: 31.4 g/dL (ref 30.0–36.0)
MCV: 82 fL (ref 79–97)
MCV: 83.9 fL (ref 80.0–100.0)
Platelets: 206 10*3/uL (ref 150–450)
Platelets: 157 10*3/uL (ref 150–400)
RBC: 5.31 x10E6/uL (ref 4.14–5.80)
RBC: 4.71 MIL/uL (ref 4.22–5.81)
RDW: 14.4 % (ref 11.6–15.4)
RDW: 15.3 % (ref 11.5–15.5)
WBC: 9.4 10*3/uL (ref 3.4–10.8)
WBC: 12.7 10*3/uL — ABNORMAL HIGH (ref 4.0–10.5)
nRBC: 0 % (ref 0.0–0.2)

## 2023-04-16 LAB — GASTROINTESTINAL PANEL BY PCR, STOOL (REPLACES STOOL CULTURE)

## 2023-04-16 LAB — BASIC METABOLIC PANEL WITH GFR
Anion gap: 8 (ref 5–15)
BUN: 16 mg/dL (ref 6–20)
CO2: 21 mmol/L — ABNORMAL LOW (ref 22–32)
Calcium: 7.9 mg/dL — ABNORMAL LOW (ref 8.9–10.3)
Chloride: 108 mmol/L (ref 98–111)
Creatinine, Ser: 1.37 mg/dL — ABNORMAL HIGH (ref 0.61–1.24)
GFR, Estimated: >60 mL/min (ref >60)
Glucose, Bld: 112 mg/dL — ABNORMAL HIGH (ref 70–99)
Potassium: 4.1 mmol/L (ref 3.5–5.1)
Sodium: 137 mmol/L (ref 135–145)

## 2023-04-16 LAB — HIV ANTIBODY (ROUTINE TESTING W REFLEX): HIV Screen 4th Generation wRfx: NONREACTIVE

## 2023-04-16 LAB — COMPREHENSIVE METABOLIC PANEL WITH GFR
ALT: 44 IU/L (ref 0–44)
AST: 35 IU/L (ref 0–40)
Albumin: 3.7 g/dL — ABNORMAL LOW (ref 4.3–5.2)
Alkaline Phosphatase: 23 IU/L — ABNORMAL LOW (ref 44–121)
BUN/Creatinine Ratio: 15 (ref 9–20)
BUN: 19 mg/dL (ref 6–20)
Bilirubin Total: 0.2 mg/dL (ref 0.0–1.2)
CO2: 19 mmol/L — ABNORMAL LOW (ref 20–29)
Calcium: 8.8 mg/dL (ref 8.7–10.2)
Chloride: 104 mmol/L (ref 96–106)
Creatinine, Ser: 1.26 mg/dL (ref 0.76–1.27)
Globulin, Total: 3.1 g/dL (ref 1.5–4.5)
Glucose: 92 mg/dL (ref 70–99)
Potassium: 3.8 mmol/L (ref 3.5–5.2)
Sodium: 139 mmol/L (ref 134–144)
Total Protein: 6.8 g/dL (ref 6.0–8.5)
eGFR: 79 mL/min/{1.73_m2} (ref >59)

## 2023-04-16 LAB — HEPATIC FUNCTION PANEL
ALT: 376 U/L — ABNORMAL HIGH (ref 0–44)
AST: 339 U/L — ABNORMAL HIGH (ref 15–41)
Albumin: 2.7 g/dL — ABNORMAL LOW (ref 3.5–5.0)
Alkaline Phosphatase: 14 U/L — ABNORMAL LOW (ref 38–126)
Bilirubin, Direct: 0.4 mg/dL — ABNORMAL HIGH (ref 0.0–0.2)
Indirect Bilirubin: 0.7 mg/dL (ref 0.3–0.9)
Total Bilirubin: 1.1 mg/dL (ref 0.0–1.2)
Total Protein: 6.1 g/dL — ABNORMAL LOW (ref 6.5–8.1)

## 2023-04-16 LAB — LACTIC ACID, PLASMA: Lactic Acid, Venous: 1.2 mmol/L (ref 0.5–1.9)

## 2023-04-16 LAB — C DIFFICILE QUICK SCREEN W PCR REFLEX
C Diff antigen: NEGATIVE
C Diff interpretation: NOT DETECTED
C Diff toxin: NEGATIVE

## 2023-04-16 LAB — HEPATITIS PANEL, ACUTE
HCV Ab: NONREACTIVE
Hep A IgM: NONREACTIVE
Hep B C IgM: NONREACTIVE
Hepatitis B Surface Ag: NONREACTIVE

## 2023-04-16 LAB — PHOSPHORUS: Phosphorus: 3.4 mg/dL (ref 2.5–4.6)

## 2023-04-16 LAB — MAGNESIUM: Magnesium: 1.8 mg/dL (ref 1.7–2.4)

## 2023-04-16 LAB — CK: Total CK: 192 U/L (ref 49–397)

## 2023-04-16 LAB — SARS CORONAVIRUS 2 BY RT PCR: SARS Coronavirus 2 by RT PCR: NEGATIVE

## 2023-04-16 LAB — LIPASE: Lipase: 40 U/L (ref 13–78)

## 2023-04-16 MED ORDER — ONDANSETRON HCL 4 MG/2ML IJ SOLN
4.0000 mg | Freq: Four times a day (QID) | INTRAMUSCULAR | Status: DC | PRN
  Administered 2023-04-16: 4 mg via INTRAVENOUS
  Filled 2023-04-16: qty 2

## 2023-04-16 MED ORDER — DILTIAZEM HCL ER COATED BEADS 180 MG PO CP24
180.0000 mg | ORAL_CAPSULE | Freq: Every day | ORAL | Status: DC
  Administered 2023-04-16 – 2023-04-17 (×3): 180 mg via ORAL
  Filled 2023-04-16 (×3): qty 1

## 2023-04-16 MED ORDER — ACETAMINOPHEN 500 MG PO TABS
1000.0000 mg | ORAL_TABLET | Freq: Four times a day (QID) | ORAL | Status: DC | PRN
  Administered 2023-04-16 – 2023-04-18 (×4): 1000 mg via ORAL
  Filled 2023-04-16 (×4): qty 2

## 2023-04-16 MED ORDER — MELATONIN 3 MG PO TABS
6.0000 mg | ORAL_TABLET | Freq: Every evening | ORAL | Status: DC | PRN
  Administered 2023-04-16 – 2023-04-17 (×3): 6 mg via ORAL
  Filled 2023-04-16 (×3): qty 2

## 2023-04-16 MED ORDER — SODIUM CHLORIDE 0.9% FLUSH
3.0000 mL | Freq: Two times a day (BID) | INTRAVENOUS | Status: DC
  Administered 2023-04-16 – 2023-04-18 (×4): 3 mL via INTRAVENOUS

## 2023-04-16 MED ORDER — ESCITALOPRAM OXALATE 10 MG PO TABS
10.0000 mg | ORAL_TABLET | Freq: Every day | ORAL | Status: DC
  Administered 2023-04-16 – 2023-04-18 (×3): 10 mg via ORAL
  Filled 2023-04-16 (×3): qty 1

## 2023-04-16 MED ORDER — ENOXAPARIN SODIUM 80 MG/0.8ML IJ SOSY
70.0000 mg | PREFILLED_SYRINGE | INTRAMUSCULAR | Status: DC
  Administered 2023-04-16 – 2023-04-17 (×2): 70 mg via SUBCUTANEOUS
  Filled 2023-04-16 (×3): qty 0.8

## 2023-04-16 MED ORDER — PROCHLORPERAZINE EDISYLATE 10 MG/2ML IJ SOLN: 10.0000 mg | Freq: Four times a day (QID) | INTRAMUSCULAR | Status: DC | PRN

## 2023-04-16 NOTE — Plan of Care (Signed)

## 2023-04-16 NOTE — Progress Notes (Signed)
 PROGRESS NOTE    Lee Jones  WUJ:[id number] DOB: 02-28-1995 DOA: 04/15/2023 PCP: System, Provider Not In   Brief Narrative:  Lee Jones is a 28 y.o. male with hx of hypertension, mood disorder, who presented with intractable diarrhea.  Reports that symptoms started on Thursday with generalized muscle aches.  He initially thought this was from weight lifting.  However Friday started developing fevers, nausea and episode of vomiting x 1, and copious diarrhea.  Having greater than 10 episodes of diarrhea per day described as watery.  No additional vomiting since initial episode.  Fevers as high as 103F at home.  Was seen in urgent care but due to worsening symptoms sought care in the ED and eventually admitted under hospital service secondary to sepsis secondary to presumed viral gastroenteritis.  Assessment & Plan:   Principal Problem:   Gastroenteritis  Sepsis secondary to presumed viral gastroenteritis, POA: Met sepsis criteria due to fever 103.2, tachypnea, tachycardia.  Lactic acid was normal.  CT abdomen pelvis unremarkable.  Received fluid bolus in the ED.  Started on Rocephin and Flagyl empirically which I will continue until GI pathogen panel results are available.  C. difficile negative. Continue to manage as inpatient with supportive care and plan to discharge when better symptomatically and afebrile, could be tomorrow.  Will advance to soft diet.  AKI: Presented with creatinine of 1.26 which got worse to 1.5 to.  Started on fluids.  Creatinine improved 1.37 today.  Will continue IV fluids.  Repeat labs in the morning.  Avoid nephrotoxic agents.  Elevated LFT: AST 35 and ALT 44 upon arrival which has rising significantly and has AST of 339 and ALT of 376 today.  Could very well be due to sepsis.  Need to trend LFTs daily.  Essential hypertension: Blood pressure controlled.  Continue diltiazem but holding losartan due to AKI.  Mood disorder: Continue escitalopram.  Morbid  obesity class III: Weight loss and diet modification counseled.  DVT prophylaxis: SCD   Code Status: Full Code  Family Communication: Girlfriend present at bedside.  Plan of care discussed with patient in length and he/she verbalized understanding and agreed with it.  Status is: Observation The patient will require care spanning > 2 midnights and should be moved to inpatient because: Needs inpatient management.   Estimated body mass index is 47.27 kg/m as calculated from the following:   Height as of this encounter: 5\' 9"  (1.753 m).   Weight as of this encounter: 145.2 kg.    Nutritional Assessment: Body mass index is 47.27 kg/m.Marland Kitchen Seen by dietician.  I agree with the assessment and plan as outlined below: Nutrition Status:        . Skin Assessment: I have examined the patient's skin and I agree with the wound assessment as performed by the wound care RN as outlined below:    Consultants:  None  Procedures:  None  Antimicrobials:  Anti-infectives (From admission, onward)    Start     Dose/Rate Route Frequency Ordered Stop   04/16/23 0100  cefTRIAXone (ROCEPHIN) 2 g in sodium chloride 0.9 % 100 mL IVPB        2 g 200 mL/hr over 30 Minutes Intravenous Every 24 hours 04/15/23 2358     04/16/23 0100  metroNIDAZOLE (FLAGYL) tablet 500 mg        500 mg Oral Every 12 hours 04/15/23 2358           Subjective: Patient seen and examined.  Still complains of multiple  episodes of diarrhea, too many to count, per him.  No nausea but still has abdominal pain at times.  Wants to try soft diet.  Objective: Vitals:   04/16/23 0041 04/16/23 0151 04/16/23 0329 04/16/23 0620  BP: (!) 159/99 125/73 (!) 148/73 136/73  Pulse: (!) 111 (!) 104 (!) 112 89  Resp: 19 20 20 20   Temp: (!) 101.1 F (38.4 C) 99.1 F (37.3 C) 100.2 F (37.9 C) 98.9 F (37.2 C)  TempSrc: Oral Oral Oral Oral  SpO2: 99% 98% 100% 97%  Weight:      Height:        Intake/Output Summary (Last 24 hours)  at 04/16/2023 0821 Last data filed at 04/16/2023 1610 Gross per 24 hour  Intake 2356.13 ml  Output 550 ml  Net 1806.13 ml   Filed Weights   04/15/23 2040  Weight: (!) 145.2 kg    Examination:  General exam: Appears calm and comfortable, obese Respiratory system: Clear to auscultation. Respiratory effort normal. Cardiovascular system: S1 & S2 heard, RRR. No JVD, murmurs, rubs, gallops or clicks. No pedal edema. Gastrointestinal system: Abdomen is nondistended, soft and mild generalized discomfort. No organomegaly or masses felt. Normal bowel sounds heard. Central nervous system: Alert and oriented. No focal neurological deficits. Extremities: Symmetric 5 x 5 power. Skin: No rashes, lesions or ulcers Psychiatry: Judgement and insight appear normal. Mood & affect appropriate.    Data Reviewed: I have personally reviewed following labs and imaging studies  CBC: Recent Labs  Lab 04/15/23 1233 04/15/23 2054 04/16/23 0148  WBC 9.4 10.3 12.7*  HGB 13.8 13.6 12.4*  HCT 43.5 43.8 39.5  MCV 82 84.7 83.9  PLT 206 189 157   Basic Metabolic Panel: Recent Labs  Lab 04/15/23 1233 04/15/23 2054 04/16/23 0148  NA 139 137 137  K 3.8 3.7 4.1  CL 104 106 108  CO2 19* 21* 21*  GLUCOSE 92 127* 112*  BUN 19 18 16   CREATININE 1.26 1.52* 1.37*  CALCIUM 8.8 8.7* 7.9*  MG  --   --  1.8  PHOS  --   --  3.4   GFR: Estimated Creatinine Clearance: 113.1 mL/min (A) (by C-G formula based on SCr of 1.37 mg/dL (H)). Liver Function Tests: Recent Labs  Lab 04/15/23 1233 04/15/23 2054 04/16/23 0148  AST 35 144* 339*  ALT 44 157* 376*  ALKPHOS 23* 16* 14*  BILITOT 0.2 0.6 1.1  PROT 6.8 6.7 6.1*  ALBUMIN 3.7* 3.1* 2.7*   Recent Labs  Lab 04/15/23 1233 04/15/23 2054  LIPASE 40 31   No results for input(s): "AMMONIA" in the last 168 hours. Coagulation Profile: No results for input(s): "INR", "PROTIME" in the last 168 hours. Cardiac Enzymes: Recent Labs  Lab 04/16/23 0148   CKTOTAL 192   BNP (last 3 results) No results for input(s): "PROBNP" in the last 8760 hours. HbA1C: No results for input(s): "HGBA1C" in the last 72 hours. CBG: No results for input(s): "GLUCAP" in the last 168 hours. Lipid Profile: No results for input(s): "CHOL", "HDL", "LDLCALC", "TRIG", "CHOLHDL", "LDLDIRECT" in the last 72 hours. Thyroid Function Tests: No results for input(s): "TSH", "T4TOTAL", "FREET4", "T3FREE", "THYROIDAB" in the last 72 hours. Anemia Panel: No results for input(s): "VITAMINB12", "FOLATE", "FERRITIN", "TIBC", "IRON", "RETICCTPCT" in the last 72 hours. Sepsis Labs: Recent Labs  Lab 04/16/23 0148  LATICACIDVEN 1.2    Recent Results (from the past 240 hours)  Respiratory (~20 pathogens) panel by PCR     Status: None  Collection Time: 04/15/23 11:57 PM   Specimen: Nasopharyngeal Swab; Respiratory  Result Value Ref Range Status   Adenovirus NOT DETECTED NOT DETECTED Final   Coronavirus 229E NOT DETECTED NOT DETECTED Final    Comment: (NOTE) The Coronavirus on the Respiratory Panel, DOES NOT test for the novel  Coronavirus (2019 nCoV)    Coronavirus HKU1 NOT DETECTED NOT DETECTED Final   Coronavirus NL63 NOT DETECTED NOT DETECTED Final   Coronavirus OC43 NOT DETECTED NOT DETECTED Final   Metapneumovirus NOT DETECTED NOT DETECTED Final   Rhinovirus / Enterovirus NOT DETECTED NOT DETECTED Final   Influenza A NOT DETECTED NOT DETECTED Final   Influenza B NOT DETECTED NOT DETECTED Final   Parainfluenza Virus 1 NOT DETECTED NOT DETECTED Final   Parainfluenza Virus 2 NOT DETECTED NOT DETECTED Final   Parainfluenza Virus 3 NOT DETECTED NOT DETECTED Final   Parainfluenza Virus 4 NOT DETECTED NOT DETECTED Final   Respiratory Syncytial Virus NOT DETECTED NOT DETECTED Final   Bordetella pertussis NOT DETECTED NOT DETECTED Final   Bordetella Parapertussis NOT DETECTED NOT DETECTED Final   Chlamydophila pneumoniae NOT DETECTED NOT DETECTED Final   Mycoplasma  pneumoniae NOT DETECTED NOT DETECTED Final    Comment: Performed at [facility], [address], [place], [place] [address]  C Difficile Quick Screen w PCR reflex     Status: None   Collection Time: 04/16/23  3:45 AM   Specimen: STOOL  Result Value Ref Range Status   C Diff antigen NEGATIVE NEGATIVE Final   C Diff toxin NEGATIVE NEGATIVE Final   C Diff interpretation No C. difficile detected.  Final    Comment: Performed at [facility], [address] [place], [place] [address]  SARS Coronavirus 2 by RT PCR (hospital order, performed in [facility] hospital lab) *cepheid single result test* Nasopharyngeal Swab     Status: None   Collection Time: 04/16/23  4:00 AM   Specimen: Nasopharyngeal Swab; Nasal Swab  Result Value Ref Range Status   SARS Coronavirus 2 by RT PCR NEGATIVE NEGATIVE Final    Comment: Performed at [facility], [address] [place], [place] [address]     Radiology Studies: CT ABDOMEN PELVIS W CONTRAST Result Date: 04/15/2023 CLINICAL DATA:  Acute nonlocalized abdominal pain. Nausea, vomiting, diarrhea EXAM: CT ABDOMEN AND PELVIS WITH CONTRAST TECHNIQUE: Multidetector CT imaging of the abdomen and pelvis was performed using the standard protocol following bolus administration of intravenous contrast. RADIATION DOSE REDUCTION: This exam was performed according to the departmental dose-optimization program which includes automated exposure control, adjustment of the mA and/or kV according to patient size and/or use of iterative reconstruction technique. CONTRAST:  75mL OMNIPAQUE IOHEXOL 350 MG/ML SOLN COMPARISON:  None Available. FINDINGS: Lower chest: No acute abnormality. Hepatobiliary: No focal liver abnormality is seen. No gallstones, gallbladder wall thickening, or biliary dilatation. Pancreas: Unremarkable Spleen: Unremarkable Adrenals/Urinary Tract: Adrenal glands are unremarkable. Kidneys are normal, without renal calculi, focal lesion, or  hydronephrosis. Bladder is unremarkable. Stomach/Bowel: Mild sigmoid diverticulosis. Stomach, small bowel, and large bowel are otherwise unremarkable. Appendix normal. No evidence of obstruction or focal inflammation. No free intraperitoneal gas or fluid. Vascular/Lymphatic: Choose wound Reproductive: Prostate is unremarkable. Other: No abdominal wall hernia or abnormality. No abdominopelvic ascites. Musculoskeletal: No acute or significant osseous findings. IMPRESSION: 1. No acute intra-abdominal pathology identified. No definite radiographic explanation for the patient's reported symptoms. 2. Mild sigmoid diverticulosis without superimposed acute inflammatory change. Electronically Signed   By:  Helyn Numbers M.D.   On: 04/15/2023 22:27    Scheduled Meds:  diltiazem  180 mg Oral QHS   escitalopram  10 mg Oral Daily   metroNIDAZOLE  500 mg Oral Q12H   sodium chloride flush  3 mL Intravenous Q12H   Continuous Infusions:  sodium chloride 75 mL/hr at 04/16/23 0981   cefTRIAXone (ROCEPHIN)  IV Stopped (04/16/23 0155)     LOS: 0 days   Hughie Closs, MD [facility]  04/16/2023, 8:21 AM   *Please note that this is a verbal dictation therefore any spelling or grammatical errors are due to the "Dragon [employer] One" system interpretation.  Please page via Amion and do not message via secure chat for urgent patient care matters. Secure chat can be used for non urgent patient care matters.  How to contact the [facility] Attending or Consulting provider 7A - 7P or covering provider during after hours 7P -7A, for this patient?  Check the care team in [facility] and look for a) attending/consulting TRH provider listed and b) the [facility] team listed. Page or secure chat 7A-7P. Log into [email or web address] and use [facility]'s universal password to access. If you do not have the password, please contact the hospital operator. Locate the [facility] provider you are looking for under [facility] and page to a number that  you can be directly reached. If you still have difficulty reaching the provider, please page the [facility] (Director on Call) for the Hospitalists listed on amion for assistance.

## 2023-04-16 NOTE — Plan of Care (Signed)

## 2023-04-16 NOTE — H&P (Signed)
 History and Physical    Mikah Chacko OZH:[id number] DOB: August 09, 1995 DOA: 04/15/2023  PCP: System, Provider Not In   Patient coming from: Home   Chief Complaint:  Chief Complaint  Patient presents with   Abdominal Pain    HPI:  Lee Jones is a 28 y.o. male with hx of hypertension, mood disorder, who presents with intractable diarrhea.  Reports that symptoms started on Thursday with generalized muscle aches.  Thought this was from weight lifting.  However Friday developing fevers, nausea and episode of vomiting x 1, and copious diarrhea.  Having greater than 10 episodes of diarrhea per day described as watery.  No additional vomiting since initial episode.  Fevers as high as 103F at home.  Was seen in urgent care today but due to worsening symptoms sought care in the ED here.   Review of Systems:  ROS complete and negative except as marked above   Allergies  Allergen Reactions   Bee Venom     Family has a history of anaphylaxis and pt believes he may have the same reaction    Prior to Admission medications   Medication Sig Start Date End Date Taking? Authorizing Provider  diltiazem (CARDIZEM CD) 180 MG 24 hr capsule Take 180 mg by mouth at bedtime.   Yes [provider]  EPINEPHrine 0.3 mg/0.3 mL IJ SOAJ injection Inject 0.3 mg into the muscle as needed. 11/29/21  Yes   escitalopram (LEXAPRO) 10 MG tablet Take 1 tablet (10 mg total) by mouth daily. 02/15/23  Yes   losartan (COZAAR) 100 MG tablet Take 1 tablet (100 mg total) by mouth daily. 03/29/22  Yes   meclizine (ANTIVERT) 12.5 MG tablet Take 1 tablet (12.5 mg total) by mouth 3 (three) times daily as needed for dizziness. 04/15/23  Yes Carlisle Beers, FNP  ondansetron (ZOFRAN-ODT) 4 MG disintegrating tablet Take 1 tablet (4 mg total) by mouth every 8 (eight) hours as needed for nausea or vomiting. 04/15/23  Yes Carlisle Beers, FNP  tirzepatide (ZEPBOUND) 2.5 MG/0.5ML Pen Inject 2.5 mg into the skin  once a week. Patient not taking: Reported on 04/15/2023 03/30/23     telmisartan (MICARDIS) 40 MG tablet Take 1 tablet (40 mg total) by mouth once daily for 90 days 08/04/21 08/04/21      Past Medical History:  Diagnosis Date   Anxiety    Depression    Hypertension     History reviewed. No pertinent surgical history.   reports that he has never smoked. He has never used smokeless tobacco. He reports current alcohol use. He reports that he does not use drugs.  Family History  Problem Relation Age of Onset   Chronic Renal Failure Mother    High Cholesterol Mother    Hyperlipidemia Father    Heart failure Father      Physical Exam: Vitals:   04/15/23 2140 04/15/23 2256 04/16/23 0041 04/16/23 0151  BP: (!) 180/115  (!) 159/99 125/73  Pulse: (!) 121 (!) 109 (!) 111 (!) 104  Resp: (!) 32 (!) 36 19 20  Temp:   (!) 101.1 F (38.4 C) 99.1 F (37.3 C)  TempSrc:   Oral Oral  SpO2: 97% 95% 99% 98%  Weight:      Height:        Gen: Awake, alert, NAD  CV: Regular, normal S1, S2, no murmurs  Resp: Normal WOB, CTAB  Abd: Flat, hyperactive, mild diffuse tenderness MSK: Symmetric, no edema  Skin: No rashes or lesions  to exposed skin  Neuro: Alert and interactive  Psych: euthymic, appropriate    Data review:   Labs reviewed, notable for:   Creatinine 1.5 up from baseline 1.1 Bicarb 21 AST 144, ALT 157 WBC 10  Micro:  No results found for this or any previous visit.  Imaging reviewed:  CT ABDOMEN PELVIS W CONTRAST Result Date: 04/15/2023 CLINICAL DATA:  Acute nonlocalized abdominal pain. Nausea, vomiting, diarrhea EXAM: CT ABDOMEN AND PELVIS WITH CONTRAST TECHNIQUE: Multidetector CT imaging of the abdomen and pelvis was performed using the standard protocol following bolus administration of intravenous contrast. RADIATION DOSE REDUCTION: This exam was performed according to the departmental dose-optimization program which includes automated exposure control, adjustment of the  mA and/or kV according to patient size and/or use of iterative reconstruction technique. CONTRAST:  75mL OMNIPAQUE IOHEXOL 350 MG/ML SOLN COMPARISON:  None Available. FINDINGS: Lower chest: No acute abnormality. Hepatobiliary: No focal liver abnormality is seen. No gallstones, gallbladder wall thickening, or biliary dilatation. Pancreas: Unremarkable Spleen: Unremarkable Adrenals/Urinary Tract: Adrenal glands are unremarkable. Kidneys are normal, without renal calculi, focal lesion, or hydronephrosis. Bladder is unremarkable. Stomach/Bowel: Mild sigmoid diverticulosis. Stomach, small bowel, and large bowel are otherwise unremarkable. Appendix normal. No evidence of obstruction or focal inflammation. No free intraperitoneal gas or fluid. Vascular/Lymphatic: Choose wound Reproductive: Prostate is unremarkable. Other: No abdominal wall hernia or abnormality. No abdominopelvic ascites. Musculoskeletal: No acute or significant osseous findings. IMPRESSION: 1. No acute intra-abdominal pathology identified. No definite radiographic explanation for the patient's reported symptoms. 2. Mild sigmoid diverticulosis without superimposed acute inflammatory change. Electronically Signed   By: Helyn Numbers M.D.   On: 04/15/2023 22:27     ED Course:  Treated with 1 L IV fluid, Tylenol, Zofran.   Assessment/Plan:  28 y.o. male with hx hypertension, mood disorder who presented with intractable diarrhea.  Gastroenteritis, suspected viral Sepsis secondary to above, improving 2 days of intractable diarrhea, nausea and 1 episode of vomiting, myalgias.  Positive sick contact with coworker who had norovirus in past few weeks.  On initial presentation febrile to 103.2, tachypneic into the 30s, tachycardic into the 130s.  WBC 10.  Lactate is ordered.  CT abdomen pelvis with no acute findings. - S/p 1 L IV fluid, given additional 1 L IV fluid and then started on maintenance IV fluids at 75 cc an hour -Check GI pathogen panel,  C. Difficile, RVP. Check lactate, blood cultures, CK.  - Although suspect viral cause out of abundance of caution started on ceftriaxone and Flagyl.  May discontinue if blood cultures negative -Supportive care Tylenol as needed fevers, Zofran as needed nausea, Compazine second line  AKI stage I Baseline creatinine around 1.1, elevated to 1.5 on admission.  Likely prerenal in the setting of GI losses. - IV fluids per above -Hold home losartan  Acute liver injury, hepatocellular pattern AST 144, ALT 157.  Likely viral in the setting of GI illness above.  Hep A, B, C serology negative.  CT abdomen pelvis with no hepatobiliary abnormality. -Trend LFT   Chronic medical problems Hypertension: Continue home diltiazem.  Hold losartan with AKI Mood disorder: Continue home escitalopram  Body mass index is 47.27 kg/m.  Obesity class III would benefit from weight loss outpatient  DVT prophylaxis:   None Code Status:  Full Code Diet:  Diet Orders (From admission, onward)     Start     Ordered   04/16/23 0001  Diet clear liquid Room service appropriate? Yes; Fluid consistency: Thin  Diet  effective now       Question Answer Comment  Room service appropriate? Yes   Fluid consistency: Thin      04/16/23 0002           Family Communication: No Consults: None Admission status:   Observation, Telemetry bed  Severity of Illness: The appropriate patient status for this patient is OBSERVATION. Observation status is judged to be reasonable and necessary in order to provide the required intensity of service to ensure the patient's safety. The patient's presenting symptoms, physical exam findings, and initial radiographic and laboratory data in the context of their medical condition is felt to place them at decreased risk for further clinical deterioration. Furthermore, it is anticipated that the patient will be medically stable for discharge from the hospital within 2 midnights of admission.     Dolly Rias, MD [facility]  How to contact the [facility] Attending or Consulting provider 7A - 7P or covering provider during after hours 7P -7A, for this patient.  Check the care team in [facility] and look for a) attending/consulting TRH provider listed and b) the [facility] team listed Log into [email or web address] and use [facility]'s universal password to access. If you do not have the password, please contact the hospital operator. Locate the [facility] provider you are looking for under [facility] and page to a number that you can be directly reached. If you still have difficulty reaching the provider, please page the [facility] (Director on Call) for the Hospitalists listed on amion for assistance.  04/16/2023, 1:57 AM

## 2023-04-17 DIAGNOSIS — K529 Noninfective gastroenteritis and colitis, unspecified: Secondary | ICD-10-CM | POA: Diagnosis not present

## 2023-04-17 LAB — CBC WITH DIFFERENTIAL/PLATELET
Abs Immature Granulocytes: 0.02 10*3/uL (ref 0.00–0.07)
Basophils Absolute: 0 10*3/uL (ref 0.0–0.1)
Basophils Relative: 0 %
Eosinophils Absolute: 0.2 10*3/uL (ref 0.0–0.5)
Eosinophils Relative: 2 %
HCT: 38.7 % — ABNORMAL LOW (ref 39.0–52.0)
Hemoglobin: 12 g/dL — ABNORMAL LOW (ref 13.0–17.0)
Immature Granulocytes: 0 %
Lymphocytes Relative: 23 %
Lymphs Abs: 2 10*3/uL (ref 0.7–4.0)
MCH: 25.8 pg — ABNORMAL LOW (ref 26.0–34.0)
MCHC: 31 g/dL (ref 30.0–36.0)
MCV: 83.2 fL (ref 80.0–100.0)
Monocytes Absolute: 0.6 10*3/uL (ref 0.1–1.0)
Monocytes Relative: 7 %
Neutro Abs: 5.6 10*3/uL (ref 1.7–7.7)
Neutrophils Relative %: 68 %
Platelets: 141 10*3/uL — ABNORMAL LOW (ref 150–400)
RBC: 4.65 MIL/uL (ref 4.22–5.81)
RDW: 15.3 % (ref 11.5–15.5)
WBC: 8.4 10*3/uL (ref 4.0–10.5)
nRBC: 0 % (ref 0.0–0.2)

## 2023-04-17 LAB — COMPREHENSIVE METABOLIC PANEL WITH GFR
ALT: 393 U/L — ABNORMAL HIGH (ref 0–44)
AST: 114 U/L — ABNORMAL HIGH (ref 15–41)
Albumin: 2.6 g/dL — ABNORMAL LOW (ref 3.5–5.0)
Alkaline Phosphatase: 15 U/L — ABNORMAL LOW (ref 38–126)
Anion gap: 9 (ref 5–15)
BUN: 11 mg/dL (ref 6–20)
CO2: 20 mmol/L — ABNORMAL LOW (ref 22–32)
Calcium: 8.4 mg/dL — ABNORMAL LOW (ref 8.9–10.3)
Chloride: 110 mmol/L (ref 98–111)
Creatinine, Ser: 1.06 mg/dL (ref 0.61–1.24)
GFR, Estimated: >60 mL/min (ref >60)
Glucose, Bld: 91 mg/dL (ref 70–99)
Potassium: 3.4 mmol/L — ABNORMAL LOW (ref 3.5–5.1)
Sodium: 139 mmol/L (ref 135–145)
Total Bilirubin: 0.3 mg/dL (ref 0.0–1.2)
Total Protein: 5.9 g/dL — ABNORMAL LOW (ref 6.5–8.1)

## 2023-04-17 MED ORDER — POTASSIUM CHLORIDE CRYS ER 20 MEQ PO TBCR
40.0000 meq | EXTENDED_RELEASE_TABLET | ORAL | Status: AC
  Administered 2023-04-17 (×2): 40 meq via ORAL
  Filled 2023-04-17 (×2): qty 2

## 2023-04-17 MED ORDER — SODIUM CHLORIDE 0.9 % IV SOLN: INTRAVENOUS | Status: AC

## 2023-04-17 NOTE — Progress Notes (Addendum)
 PROGRESS NOTE    Lee Jones  RUE:[id number] DOB: [birth year] DOA: 04/15/2023 PCP: System, Provider Not In   Brief Narrative:  Amr Sturtevant is a 28 y.o. male with hx of hypertension, mood disorder, who presented with intractable diarrhea.  Reports that symptoms started on Thursday with generalized muscle aches.  He initially thought this was from weight lifting.  However Friday started developing fevers, nausea and episode of vomiting x 1, and copious diarrhea.  Having greater than 10 episodes of diarrhea per day described as watery.  No additional vomiting since initial episode.  Fevers as high as 103F at home.  Was seen in urgent care but due to worsening symptoms sought care in the ED and eventually admitted under hospital service secondary to sepsis secondary to presumed viral gastroenteritis.  Assessment & Plan:   Principal Problem:   Gastroenteritis Active Problems:   Sepsis (HCC)  Sepsis secondary to viral gastroenteritis/astrovirus, POA: Met sepsis criteria due to fever 103.2, tachypnea, tachycardia.  Lactic acid was normal.  CT abdomen pelvis unremarkable.  Received fluid bolus in the ED.  Started on Rocephin and Flagyl empirically.  C. difficile negative but GI pathogen panel positive for astrovirus.  Fortunately, he is now afebrile for 24 hours but still continues to have approximately 15 loose/watery bowel movements in last 24 hours.  Bacterial infection ruled out so we will discontinue antibiotics.  Continue supportive care with IV fluids.  He has no nausea, tolerating diet.  AKI: Presented with creatinine of 1.26 which got worse to 1.5 to.  Started on fluids.  Creatinine improved 1.37 just today.  Will continue IV fluids.  Repeat labs are pending still.  Continue to avoid nephrotoxic agents.  Elevated LFT: AST 35 and ALT 44 upon arrival which has rising significantly and has AST of 339 and ALT of 376 yesterday.  Could very well be due to sepsis.  Need to trend LFTs daily.   Labs pending this morning.  Essential hypertension: Blood pressure controlled.  Continue diltiazem but holding losartan due to AKI.  Mood disorder: Continue escitalopram.  Hypokalemia: Replenished.  Morbid obesity class III: Weight loss and diet modification counseled.  DVT prophylaxis: SCD   Code Status: Full Code  Family Communication: none present at bedside.  Plan of care discussed with patient in length and he/she verbalized understanding and agreed with it.  Status is: Inpatient Remains inpatient appropriate because: Still several bowel movements a day needs IV fluids.    Estimated body mass index is 47.27 kg/m as calculated from the following:   Height as of this encounter: 5\' 9"  (1.753 m).   Weight as of this encounter: 145.2 kg.    Nutritional Assessment: Body mass index is 47.27 kg/m.Marland Kitchen Seen by dietician.  I agree with the assessment and plan as outlined below: Nutrition Status:        . Skin Assessment: I have examined the patient's skin and I agree with the wound assessment as performed by the wound care RN as outlined below:    Consultants:  None  Procedures:  None  Antimicrobials:  Anti-infectives (From admission, onward)    Start     Dose/Rate Route Frequency Ordered Stop   04/16/23 0100  cefTRIAXone (ROCEPHIN) 2 g in sodium chloride 0.9 % 100 mL IVPB        2 g 200 mL/hr over 30 Minutes Intravenous Every 24 hours 04/15/23 2358     04/16/23 0100  metroNIDAZOLE (FLAGYL) tablet 500 mg  500 mg Oral Every 12 hours 04/15/23 2358           Subjective: Patient seen and examined.  Other than multiple episodes of diarrhea, no other complaint.  No abdominal pain or nausea.  Objective: Vitals:   04/16/23 2106 04/17/23 0010 04/17/23 0417 04/17/23 0812  BP: (!) 148/55 (!) 149/87 125/74 126/78  Pulse: 87 82 77 85  Resp:  20 18 18   Temp:  98.9 F (37.2 C) 98.2 F (36.8 C) 98.5 F (36.9 C)  TempSrc:  Oral Oral Oral  SpO2:  99% 96% 100%   Weight:      Height:        Intake/Output Summary (Last 24 hours) at 04/17/2023 1015 Last data filed at 04/17/2023 0640 Gross per 24 hour  Intake 1894.32 ml  Output 325 ml  Net 1569.32 ml   Filed Weights   04/15/23 2040  Weight: (!) 145.2 kg    Examination:  General exam: Appears calm and comfortable  Respiratory system: Clear to auscultation. Respiratory effort normal. Cardiovascular system: S1 & S2 heard, RRR. No JVD, murmurs, rubs, gallops or clicks. No pedal edema. Gastrointestinal system: Abdomen is nondistended, soft and nontender. No organomegaly or masses felt. Normal bowel sounds heard. Central nervous system: Alert and oriented. No focal neurological deficits. Extremities: Symmetric 5 x 5 power. Skin: No rashes, lesions or ulcers.  Psychiatry: Judgement and insight appear normal. Mood & affect appropriate.   Data Reviewed: I have personally reviewed following labs and imaging studies  CBC: Recent Labs  Lab 04/15/23 1233 04/15/23 2054 04/16/23 0148  WBC 9.4 10.3 12.7*  HGB 13.8 13.6 12.4*  HCT 43.5 43.8 39.5  MCV 82 84.7 83.9  PLT 206 189 157   Basic Metabolic Panel: Recent Labs  Lab 04/15/23 1233 04/15/23 2054 04/16/23 0148  NA 139 137 137  K 3.8 3.7 4.1  CL 104 106 108  CO2 19* 21* 21*  GLUCOSE 92 127* 112*  BUN 19 18 16   CREATININE 1.26 1.52* 1.37*  CALCIUM 8.8 8.7* 7.9*  MG  --   --  1.8  PHOS  --   --  3.4   GFR: Estimated Creatinine Clearance: 113.1 mL/min (A) (by C-G formula based on SCr of 1.37 mg/dL (H)). Liver Function Tests: Recent Labs  Lab 04/15/23 1233 04/15/23 2054 04/16/23 0148  AST 35 144* 339*  ALT 44 157* 376*  ALKPHOS 23* 16* 14*  BILITOT 0.2 0.6 1.1  PROT 6.8 6.7 6.1*  ALBUMIN 3.7* 3.1* 2.7*   Recent Labs  Lab 04/15/23 1233 04/15/23 2054  LIPASE 40 31   No results for input(s): "AMMONIA" in the last 168 hours. Coagulation Profile: No results for input(s): "INR", "PROTIME" in the last 168 hours. Cardiac  Enzymes: Recent Labs  Lab 04/16/23 0148  CKTOTAL 192   BNP (last 3 results) No results for input(s): "PROBNP" in the last 8760 hours. HbA1C: No results for input(s): "HGBA1C" in the last 72 hours. CBG: No results for input(s): "GLUCAP" in the last 168 hours. Lipid Profile: No results for input(s): "CHOL", "HDL", "LDLCALC", "TRIG", "CHOLHDL", "LDLDIRECT" in the last 72 hours. Thyroid Function Tests: No results for input(s): "TSH", "T4TOTAL", "FREET4", "T3FREE", "THYROIDAB" in the last 72 hours. Anemia Panel: No results for input(s): "VITAMINB12", "FOLATE", "FERRITIN", "TIBC", "IRON", "RETICCTPCT" in the last 72 hours. Sepsis Labs: Recent Labs  Lab 04/16/23 0148  LATICACIDVEN 1.2    Recent Results (from the past 240 hours)  Respiratory (~20 pathogens) panel by PCR  Status: None   Collection Time: 04/15/23 11:57 PM   Specimen: Nasopharyngeal Swab; Respiratory  Result Value Ref Range Status   Adenovirus NOT DETECTED NOT DETECTED Final   Coronavirus 229E NOT DETECTED NOT DETECTED Final    Comment: (NOTE) The Coronavirus on the Respiratory Panel, DOES NOT test for the novel  Coronavirus (2019 nCoV)    Coronavirus HKU1 NOT DETECTED NOT DETECTED Final   Coronavirus NL63 NOT DETECTED NOT DETECTED Final   Coronavirus OC43 NOT DETECTED NOT DETECTED Final   Metapneumovirus NOT DETECTED NOT DETECTED Final   Rhinovirus / Enterovirus NOT DETECTED NOT DETECTED Final   Influenza A NOT DETECTED NOT DETECTED Final   Influenza B NOT DETECTED NOT DETECTED Final   Parainfluenza Virus 1 NOT DETECTED NOT DETECTED Final   Parainfluenza Virus 2 NOT DETECTED NOT DETECTED Final   Parainfluenza Virus 3 NOT DETECTED NOT DETECTED Final   Parainfluenza Virus 4 NOT DETECTED NOT DETECTED Final   Respiratory Syncytial Virus NOT DETECTED NOT DETECTED Final   Bordetella pertussis NOT DETECTED NOT DETECTED Final   Bordetella Parapertussis NOT DETECTED NOT DETECTED Final   Chlamydophila pneumoniae NOT  DETECTED NOT DETECTED Final   Mycoplasma pneumoniae NOT DETECTED NOT DETECTED Final    Comment: Performed at [facility], [address], [place], [place] [address]  Gastrointestinal Panel by PCR , Stool     Status: Abnormal   Collection Time: 04/16/23  3:45 AM   Specimen: STOOL  Result Value Ref Range Status   Campylobacter species NOT DETECTED NOT DETECTED Final   Plesimonas shigelloides NOT DETECTED NOT DETECTED Final   Salmonella species NOT DETECTED NOT DETECTED Final   Yersinia enterocolitica NOT DETECTED NOT DETECTED Final   Vibrio species NOT DETECTED NOT DETECTED Final   Vibrio cholerae NOT DETECTED NOT DETECTED Final   Enteroaggregative E coli (EAEC) NOT DETECTED NOT DETECTED Final   Enteropathogenic E coli (EPEC) NOT DETECTED NOT DETECTED Final   Enterotoxigenic E coli (ETEC) NOT DETECTED NOT DETECTED Final   Shiga like toxin producing E coli (STEC) NOT DETECTED NOT DETECTED Final   Shigella/Enteroinvasive E coli (EIEC) NOT DETECTED NOT DETECTED Final   Cryptosporidium NOT DETECTED NOT DETECTED Final   Cyclospora cayetanensis NOT DETECTED NOT DETECTED Final   Entamoeba histolytica NOT DETECTED NOT DETECTED Final   Giardia lamblia NOT DETECTED NOT DETECTED Final   Adenovirus F40/41 NOT DETECTED NOT DETECTED Final   Astrovirus DETECTED (A) NOT DETECTED Final   Norovirus GI/GII NOT DETECTED NOT DETECTED Final   Rotavirus A NOT DETECTED NOT DETECTED Final   Sapovirus (I, II, IV, and V) NOT DETECTED NOT DETECTED Final    Comment: Performed at [facility], [address] [place], [place] [address]  C Difficile Quick Screen w PCR reflex     Status: None   Collection Time: 04/16/23  3:45 AM   Specimen: STOOL  Result Value Ref Range Status   C Diff antigen NEGATIVE NEGATIVE Final   C Diff toxin NEGATIVE NEGATIVE Final   C Diff interpretation No C. difficile detected.  Final    Comment: Performed at [facility], [address], [place], [place] [address]   SARS Coronavirus 2 by RT PCR (hospital order, performed in [facility] hospital lab) *cepheid single result test* Nasopharyngeal Swab     Status: None   Collection Time: 04/16/23  4:00 AM   Specimen: Nasopharyngeal Swab; Nasal Swab  Result Value Ref Range Status   SARS Coronavirus 2 by  RT PCR NEGATIVE NEGATIVE Final    Comment: Performed at [facility], [address] [place], [place] [address]     Radiology Studies: CT ABDOMEN PELVIS W CONTRAST Result Date: 04/15/2023 CLINICAL DATA:  Acute nonlocalized abdominal pain. Nausea, vomiting, diarrhea EXAM: CT ABDOMEN AND PELVIS WITH CONTRAST TECHNIQUE: Multidetector CT imaging of the abdomen and pelvis was performed using the standard protocol following bolus administration of intravenous contrast. RADIATION DOSE REDUCTION: This exam was performed according to the departmental dose-optimization program which includes automated exposure control, adjustment of the mA and/or kV according to patient size and/or use of iterative reconstruction technique. CONTRAST:  75mL OMNIPAQUE IOHEXOL 350 MG/ML SOLN COMPARISON:  None Available. FINDINGS: Lower chest: No acute abnormality. Hepatobiliary: No focal liver abnormality is seen. No gallstones, gallbladder wall thickening, or biliary dilatation. Pancreas: Unremarkable Spleen: Unremarkable Adrenals/Urinary Tract: Adrenal glands are unremarkable. Kidneys are normal, without renal calculi, focal lesion, or hydronephrosis. Bladder is unremarkable. Stomach/Bowel: Mild sigmoid diverticulosis. Stomach, small bowel, and large bowel are otherwise unremarkable. Appendix normal. No evidence of obstruction or focal inflammation. No free intraperitoneal gas or fluid. Vascular/Lymphatic: Choose wound Reproductive: Prostate is unremarkable. Other: No abdominal wall hernia or abnormality. No abdominopelvic ascites. Musculoskeletal: No acute or significant osseous findings. IMPRESSION: 1. No acute intra-abdominal pathology  identified. No definite radiographic explanation for the patient's reported symptoms. 2. Mild sigmoid diverticulosis without superimposed acute inflammatory change. Electronically Signed   By: Helyn Numbers M.D.   On: 04/15/2023 22:27    Scheduled Meds:  diltiazem  180 mg Oral QHS   enoxaparin (LOVENOX) injection  70 mg Subcutaneous Q24H   escitalopram  10 mg Oral Daily   metroNIDAZOLE  500 mg Oral Q12H   sodium chloride flush  3 mL Intravenous Q12H   Continuous Infusions:  sodium chloride 75 mL/hr at 04/17/23 0923   cefTRIAXone (ROCEPHIN)  IV Stopped (04/17/23 0222)     LOS: 1 day   Hughie Closs, MD [facility]  04/17/2023, 10:15 AM   *Please note that this is a verbal dictation therefore any spelling or grammatical errors are due to the "Dragon [employer] One" system interpretation.  Please page via Amion and do not message via secure chat for urgent patient care matters. Secure chat can be used for non urgent patient care matters.  How to contact the [facility] Attending or Consulting provider 7A - 7P or covering provider during after hours 7P -7A, for this patient?  Check the care team in [facility] and look for a) attending/consulting [facility] provider listed and b) the [facility] team listed. Page or secure chat 7A-7P. Log into [email or web address] and use [place]'s universal password to access. If you do not have the password, please contact the hospital operator. Locate the [facility] provider you are looking for under [facility] and page to a number that you can be directly reached. If you still have difficulty reaching the provider, please page the [facility] (Director on Call) for the Hospitalists listed on amion for assistance.

## 2023-04-17 NOTE — Plan of Care (Signed)

## 2023-04-17 NOTE — Plan of Care (Signed)

## 2023-04-18 DIAGNOSIS — R7989 Other specified abnormal findings of blood chemistry: Secondary | ICD-10-CM | POA: Insufficient documentation

## 2023-04-18 DIAGNOSIS — K529 Noninfective gastroenteritis and colitis, unspecified: Secondary | ICD-10-CM | POA: Diagnosis not present

## 2023-04-18 DIAGNOSIS — A0832 Astrovirus enteritis: Secondary | ICD-10-CM | POA: Insufficient documentation

## 2023-04-18 LAB — CBC WITH DIFFERENTIAL/PLATELET
Abs Immature Granulocytes: 0.02 10*3/uL (ref 0.00–0.07)
Basophils Absolute: 0 10*3/uL (ref 0.0–0.1)
Basophils Relative: 0 %
Eosinophils Absolute: 0.1 10*3/uL (ref 0.0–0.5)
Eosinophils Relative: 1 %
HCT: 41.2 % (ref 39.0–52.0)
Hemoglobin: 13.1 g/dL (ref 13.0–17.0)
Immature Granulocytes: 0 %
Lymphocytes Relative: 24 %
Lymphs Abs: 2 10*3/uL (ref 0.7–4.0)
MCH: 25.9 pg — ABNORMAL LOW (ref 26.0–34.0)
MCHC: 31.8 g/dL (ref 30.0–36.0)
MCV: 81.4 fL (ref 80.0–100.0)
Monocytes Absolute: 0.8 10*3/uL (ref 0.1–1.0)
Monocytes Relative: 9 %
Neutro Abs: 5.5 10*3/uL (ref 1.7–7.7)
Neutrophils Relative %: 66 %
Platelets: 147 10*3/uL — ABNORMAL LOW (ref 150–400)
RBC: 5.06 MIL/uL (ref 4.22–5.81)
RDW: 15.1 % (ref 11.5–15.5)
WBC: 8.5 10*3/uL (ref 4.0–10.5)
nRBC: 0 % (ref 0.0–0.2)

## 2023-04-18 LAB — COMPREHENSIVE METABOLIC PANEL WITH GFR
ALT: 280 U/L — ABNORMAL HIGH (ref 0–44)
AST: 46 U/L — ABNORMAL HIGH (ref 15–41)
Albumin: 2.9 g/dL — ABNORMAL LOW (ref 3.5–5.0)
Alkaline Phosphatase: 13 U/L — ABNORMAL LOW (ref 38–126)
Anion gap: 10 (ref 5–15)
BUN: 10 mg/dL (ref 6–20)
CO2: 23 mmol/L (ref 22–32)
Calcium: 8.7 mg/dL — ABNORMAL LOW (ref 8.9–10.3)
Chloride: 104 mmol/L (ref 98–111)
Creatinine, Ser: 1.12 mg/dL (ref 0.61–1.24)
GFR, Estimated: >60 mL/min (ref >60)
Glucose, Bld: 88 mg/dL (ref 70–99)
Potassium: 3.5 mmol/L (ref 3.5–5.1)
Sodium: 137 mmol/L (ref 135–145)
Total Bilirubin: 0.4 mg/dL (ref 0.0–1.2)
Total Protein: 6.3 g/dL — ABNORMAL LOW (ref 6.5–8.1)

## 2023-04-18 NOTE — Discharge Summary (Addendum)
 Physician Discharge Summary  Lee Jones XBJ:[id number] DOB: 04-May-1995 DOA: 04/15/2023  PCP: System, Provider Not In  Admit date: 04/15/2023 Discharge date: 04/18/2023 30 Day Unplanned Readmission Risk Score    Flowsheet Row ED to Hosp-Admission (Current) from 04/15/2023 in [place] [facility]  30 Day Unplanned Readmission Risk Score (%) 13.6 Filed at 04/18/2023 0802       This score is the patient's risk of an unplanned readmission within 30 days of being discharged (0 -100%). The score is based on dignosis, age, lab data, medications, orders, and past utilization.   Low:  0-14.9   Medium: 15-21.9   High: 22-29.9   Extreme: 30 and above          Admitted From: Home Disposition: Home  Recommendations for Outpatient Follow-up:  Follow up with PCP in 1-2 weeks Please obtain CMP/CBC in one week Please follow up with your PCP on the following pending results: Unresulted Labs (From admission, onward)     Start     Ordered   04/18/23 0855  Comprehensive metabolic panel  Once,   R        04/18/23 0854              Home Health: None Equipment/Devices: None  Discharge Condition: Stable CODE STATUS: Full code Diet recommendation: Cardiac  Subjective: Seen and examined.  Feels much better.  No abdominal pain.  Has good appetite with no nausea or vomiting and tolerating diet.  Last bowel movement last night.  He is feeling comfortable going home.  Brief/Interim Summary: Lee Jones is a 28 y.o. male with hx of hypertension, mood disorder, who presented with intractable diarrhea.  Reports that symptoms started on Thursday with generalized muscle aches.  He initially thought this was from weight lifting.  However Friday started developing fevers, nausea and episode of vomiting x 1, and copious diarrhea.  Having greater than 10 episodes of diarrhea per day described as watery.  No additional vomiting since initial episode.  Fevers as high as 103F at home.  Was seen  in urgent care but due to worsening symptoms sought care in the ED and eventually admitted under hospital service secondary to sepsis secondary to presumed viral gastroenteritis. Met sepsis criteria due to fever 103.2, tachypnea, tachycardia.  Lactic acid was normal.  CT abdomen pelvis unremarkable.  Received fluid bolus in the ED.  Started on Rocephin and Flagyl empirically.  C. difficile negative but GI pathogen panel positive for astrovirus.  Fortunately, he is now afebrile for more than 48 hours and his diarrhea has subsided with no abdominal pain and he is tolerating diet, feels comfortable going home.  He was initially started on Rocephin and Flagyl empirically to treat possible bacterial gastroenteritis however since we found out that the source of infection was viral, his antibiotics were discontinued.  Leukocytosis resolved.   AKI: Presented with creatinine of 1.26 which got worse to 1.5 to.  Started on fluids.  Creatinine improved 1.37 just today.  Will continue IV fluids.  Repeat labs are pending still.  Continue to avoid nephrotoxic agents.   Elevated LFT: AST 35 and ALT 44 upon arrival which has rising significantly and has AST of 339 and ALT of 376 yesterday.  Could very well be due to sepsis.  LFTs significantly improved.  Repeat CMP at PCPs office within 1 week.   Essential hypertension: Blood pressure controlled.  PTA medications.   Mood disorder: Continue escitalopram.   Hypokalemia: Replenished.   Morbid obesity  class III: Weight loss and diet modification counseled.  Discharge plan was discussed with patient and/or family member and they verbalized understanding and agreed with it.  Discharge Diagnoses:  Principal Problem:   Gastroenteritis Active Problems:   Sepsis (HCC)   Astrovirus gastroenteritis   Elevated LFTs    Discharge Instructions   Allergies as of 04/18/2023       Reactions   Bee Venom    Family has a history of anaphylaxis and pt believes he may have the  same reaction        Medication List     TAKE these medications    diltiazem 180 MG 24 hr capsule Commonly known as: CARDIZEM CD Take 180 mg by mouth at bedtime.   EPINEPHrine 0.3 mg/0.3 mL Soaj injection Commonly known as: EPI-PEN Inject 0.3 mg into the muscle as needed.   escitalopram 10 MG tablet Commonly known as: LEXAPRO Take 1 tablet (10 mg total) by mouth daily.   losartan 100 MG tablet Commonly known as: COZAAR Take 1 tablet (100 mg total) by mouth daily.   meclizine 12.5 MG tablet Commonly known as: ANTIVERT Take 1 tablet (12.5 mg total) by mouth 3 (three) times daily as needed for dizziness.   ondansetron 4 MG disintegrating tablet Commonly known as: ZOFRAN-ODT Take 1 tablet (4 mg total) by mouth every 8 (eight) hours as needed for nausea or vomiting.   Zepbound 2.5 MG/0.5ML Pen Generic drug: tirzepatide Inject 2.5 mg into the skin once a week.        Follow-up Information     pcp Follow up in 1 week(s).                 Allergies  Allergen Reactions   Bee Venom     Family has a history of anaphylaxis and pt believes he may have the same reaction    Consultations: None   Procedures/Studies: CT ABDOMEN PELVIS W CONTRAST Result Date: 04/15/2023 CLINICAL DATA:  Acute nonlocalized abdominal pain. Nausea, vomiting, diarrhea EXAM: CT ABDOMEN AND PELVIS WITH CONTRAST TECHNIQUE: Multidetector CT imaging of the abdomen and pelvis was performed using the standard protocol following bolus administration of intravenous contrast. RADIATION DOSE REDUCTION: This exam was performed according to the departmental dose-optimization program which includes automated exposure control, adjustment of the mA and/or kV according to patient size and/or use of iterative reconstruction technique. CONTRAST:  75mL OMNIPAQUE IOHEXOL 350 MG/ML SOLN COMPARISON:  None Available. FINDINGS: Lower chest: No acute abnormality. Hepatobiliary: No focal liver abnormality is seen. No  gallstones, gallbladder wall thickening, or biliary dilatation. Pancreas: Unremarkable Spleen: Unremarkable Adrenals/Urinary Tract: Adrenal glands are unremarkable. Kidneys are normal, without renal calculi, focal lesion, or hydronephrosis. Bladder is unremarkable. Stomach/Bowel: Mild sigmoid diverticulosis. Stomach, small bowel, and large bowel are otherwise unremarkable. Appendix normal. No evidence of obstruction or focal inflammation. No free intraperitoneal gas or fluid. Vascular/Lymphatic: Choose wound Reproductive: Prostate is unremarkable. Other: No abdominal wall hernia or abnormality. No abdominopelvic ascites. Musculoskeletal: No acute or significant osseous findings. IMPRESSION: 1. No acute intra-abdominal pathology identified. No definite radiographic explanation for the patient's reported symptoms. 2. Mild sigmoid diverticulosis without superimposed acute inflammatory change. Electronically Signed   By: Helyn Numbers M.D.   On: 04/15/2023 22:27     Discharge Exam: Vitals:   04/18/23 0557 04/18/23 1042  BP: 127/77 127/79  Pulse: 76 84  Resp: 18 18  Temp: 98.5 F (36.9 C) 98.1 F (36.7 C)  SpO2: 98% 100%   Vitals:  04/17/23 1537 04/17/23 2146 04/18/23 0557 04/18/23 1042  BP: (!) 147/100 132/83 127/77 127/79  Pulse: 81 82 76 84  Resp: 16 18 18 18   Temp: 98.9 F (37.2 C) 98 F (36.7 C) 98.5 F (36.9 C) 98.1 F (36.7 C)  TempSrc: Oral Oral Oral   SpO2: 98% 99% 98% 100%  Weight:      Height:        General: Pt is alert, awake, not in acute distress Cardiovascular: RRR, S1/S2 +, no rubs, no gallops Respiratory: CTA bilaterally, no wheezing, no rhonchi Abdominal: Soft, NT, ND, bowel sounds + Extremities: no edema, no cyanosis    The results of significant diagnostics from this hospitalization (including imaging, microbiology, ancillary and laboratory) are listed below for reference.     Microbiology: Recent Results (from the past 240 hours)  Culture, blood (Routine  X 2) w Reflex to ID Panel     Status: None (Preliminary result)   Collection Time: 04/15/23  9:08 PM   Specimen: BLOOD LEFT ARM  Result Value Ref Range Status   Specimen Description BLOOD LEFT ARM  Final   Special Requests   Final    BOTTLES DRAWN AEROBIC AND ANAEROBIC Blood Culture adequate volume   Culture   Final    NO GROWTH 2 DAYS Performed at [facility], [address] [place], [place] [address]    Report Status PENDING  Incomplete  Respiratory (~20 pathogens) panel by PCR     Status: None   Collection Time: 04/15/23 11:57 PM   Specimen: Nasopharyngeal Swab; Respiratory  Result Value Ref Range Status   Adenovirus NOT DETECTED NOT DETECTED Final   Coronavirus 229E NOT DETECTED NOT DETECTED Final    Comment: (NOTE) The Coronavirus on the Respiratory Panel, DOES NOT test for the novel  Coronavirus (2019 nCoV)    Coronavirus HKU1 NOT DETECTED NOT DETECTED Final   Coronavirus NL63 NOT DETECTED NOT DETECTED Final   Coronavirus OC43 NOT DETECTED NOT DETECTED Final   Metapneumovirus NOT DETECTED NOT DETECTED Final   Rhinovirus / Enterovirus NOT DETECTED NOT DETECTED Final   Influenza A NOT DETECTED NOT DETECTED Final   Influenza B NOT DETECTED NOT DETECTED Final   Parainfluenza Virus 1 NOT DETECTED NOT DETECTED Final   Parainfluenza Virus 2 NOT DETECTED NOT DETECTED Final   Parainfluenza Virus 3 NOT DETECTED NOT DETECTED Final   Parainfluenza Virus 4 NOT DETECTED NOT DETECTED Final   Respiratory Syncytial Virus NOT DETECTED NOT DETECTED Final   Bordetella pertussis NOT DETECTED NOT DETECTED Final   Bordetella Parapertussis NOT DETECTED NOT DETECTED Final   Chlamydophila pneumoniae NOT DETECTED NOT DETECTED Final   Mycoplasma pneumoniae NOT DETECTED NOT DETECTED Final    Comment: Performed at [facility], [address] [place], [place] [address]  Culture, blood (Routine X 2) w Reflex to ID Panel     Status: None (Preliminary result)   Collection Time: 04/16/23   1:48 AM   Specimen: BLOOD RIGHT HAND  Result Value Ref Range Status   Specimen Description BLOOD RIGHT HAND  Final   Special Requests   Final    BOTTLES DRAWN AEROBIC AND ANAEROBIC Blood Culture results may not be optimal due to an inadequate volume of blood received in culture bottles   Culture   Final    NO GROWTH 2 DAYS Performed at [facility], [address] [place], [place] [address]    Report Status PENDING  Incomplete  Gastrointestinal Panel  by PCR , Stool     Status: Abnormal   Collection Time: 04/16/23  3:45 AM   Specimen: STOOL  Result Value Ref Range Status   Campylobacter species NOT DETECTED NOT DETECTED Final   Plesimonas shigelloides NOT DETECTED NOT DETECTED Final   Salmonella species NOT DETECTED NOT DETECTED Final   Yersinia enterocolitica NOT DETECTED NOT DETECTED Final   Vibrio species NOT DETECTED NOT DETECTED Final   Vibrio cholerae NOT DETECTED NOT DETECTED Final   Enteroaggregative E coli (EAEC) NOT DETECTED NOT DETECTED Final   Enteropathogenic E coli (EPEC) NOT DETECTED NOT DETECTED Final   Enterotoxigenic E coli (ETEC) NOT DETECTED NOT DETECTED Final   Shiga like toxin producing E coli (STEC) NOT DETECTED NOT DETECTED Final   Shigella/Enteroinvasive E coli (EIEC) NOT DETECTED NOT DETECTED Final   Cryptosporidium NOT DETECTED NOT DETECTED Final   Cyclospora cayetanensis NOT DETECTED NOT DETECTED Final   Entamoeba histolytica NOT DETECTED NOT DETECTED Final   Giardia lamblia NOT DETECTED NOT DETECTED Final   Adenovirus F40/41 NOT DETECTED NOT DETECTED Final   Astrovirus DETECTED (A) NOT DETECTED Final   Norovirus GI/GII NOT DETECTED NOT DETECTED Final   Rotavirus A NOT DETECTED NOT DETECTED Final   Sapovirus (I, II, IV, and V) NOT DETECTED NOT DETECTED Final    Comment: Performed at [facility], [address] [place], [place] [address]  C Difficile Quick Screen w PCR reflex     Status: None   Collection Time: 04/16/23  3:45 AM    Specimen: STOOL  Result Value Ref Range Status   C Diff antigen NEGATIVE NEGATIVE Final   C Diff toxin NEGATIVE NEGATIVE Final   C Diff interpretation No C. difficile detected.  Final    Comment: Performed at [facility], [address], [place], [place] [address]  SARS Coronavirus 2 by RT PCR (hospital order, performed in [facility] hospital lab) *cepheid single result test* Nasopharyngeal Swab     Status: None   Collection Time: 04/16/23  4:00 AM   Specimen: Nasopharyngeal Swab; Nasal Swab  Result Value Ref Range Status   SARS Coronavirus 2 by RT PCR NEGATIVE NEGATIVE Final    Comment: Performed at [facility], [address], [place], [place] [address]     Labs: BNP (last 3 results) No results for input(s): "BNP" in the last 8760 hours. Basic Metabolic Panel: Recent Labs  Lab 04/15/23 1233 04/15/23 2054 04/16/23 0148 04/17/23 0939  NA 139 137 137 139  K 3.8 3.7 4.1 3.4*  CL 104 106 108 110  CO2 19* 21* 21* 20*  GLUCOSE 92 127* 112* 91  BUN 19 18 16 11   CREATININE 1.26 1.52* 1.37* 1.06  CALCIUM 8.8 8.7* 7.9* 8.4*  MG  --   --  1.8  --   PHOS  --   --  3.4  --    Liver Function Tests: Recent Labs  Lab 04/15/23 1233 04/15/23 2054 04/16/23 0148 04/17/23 0939  AST 35 144* 339* 114*  ALT 44 157* 376* 393*  ALKPHOS 23* 16* 14* 15*  BILITOT 0.2 0.6 1.1 0.3  PROT 6.8 6.7 6.1* 5.9*  ALBUMIN 3.7* 3.1* 2.7* 2.6*   Recent Labs  Lab 04/15/23 1233 04/15/23 2054  LIPASE 40 31   No results for input(s): "AMMONIA" in the last 168 hours. CBC: Recent Labs  Lab 04/15/23 1233 04/15/23 2054 04/16/23 0148 04/17/23 0939 04/18/23 1018  WBC 9.4 10.3 12.7* 8.4 8.5  NEUTROABS  --   --   --  5.6 5.5  HGB 13.8 13.6 12.4* 12.0* 13.1  HCT 43.5 43.8 39.5 38.7* 41.2  MCV 82 84.7 83.9 83.2 81.4  PLT 206 189 157 141* 147*   Cardiac Enzymes: Recent Labs  Lab 04/16/23 0148  CKTOTAL 192   BNP: Invalid input(s): "POCBNP" CBG: No results for input(s): "GLUCAP"  in the last 168 hours. D-Dimer No results for input(s): "DDIMER" in the last 72 hours. Hgb A1c No results for input(s): "HGBA1C" in the last 72 hours. Lipid Profile No results for input(s): "CHOL", "HDL", "LDLCALC", "TRIG", "CHOLHDL", "LDLDIRECT" in the last 72 hours. Thyroid function studies No results for input(s): "TSH", "T4TOTAL", "T3FREE", "THYROIDAB" in the last 72 hours.  Invalid input(s): "FREET3" Anemia work up No results for input(s): "VITAMINB12", "FOLATE", "FERRITIN", "TIBC", "IRON", "RETICCTPCT" in the last 72 hours. Urinalysis    Component Value Date/Time   COLORURINE AMBER (A) 04/15/2023 2044   APPEARANCEUR HAZY (A) 04/15/2023 2044   LABSPEC 1.038 (H) 04/15/2023 2044   PHURINE 5.0 04/15/2023 2044   GLUCOSEU NEGATIVE 04/15/2023 2044   HGBUR NEGATIVE 04/15/2023 2044   BILIRUBINUR NEGATIVE 04/15/2023 2044   KETONESUR NEGATIVE 04/15/2023 2044   PROTEINUR 100 (A) 04/15/2023 2044   NITRITE NEGATIVE 04/15/2023 2044   LEUKOCYTESUR NEGATIVE 04/15/2023 2044   Sepsis Labs Recent Labs  Lab 04/15/23 2054 04/16/23 0148 04/17/23 0939 04/18/23 1018  WBC 10.3 12.7* 8.4 8.5   Microbiology Recent Results (from the past 240 hours)  Culture, blood (Routine X 2) w Reflex to ID Panel     Status: None (Preliminary result)   Collection Time: 04/15/23  9:08 PM   Specimen: BLOOD LEFT ARM  Result Value Ref Range Status   Specimen Description BLOOD LEFT ARM  Final   Special Requests   Final    BOTTLES DRAWN AEROBIC AND ANAEROBIC Blood Culture adequate volume   Culture   Final    NO GROWTH 2 DAYS Performed at [facility], [address], [place], [place] [address]    Report Status PENDING  Incomplete  Respiratory (~20 pathogens) panel by PCR     Status: None   Collection Time: 04/15/23 11:57 PM   Specimen: Nasopharyngeal Swab; Respiratory  Result Value Ref Range Status   Adenovirus NOT DETECTED NOT DETECTED Final   Coronavirus 229E NOT DETECTED NOT DETECTED Final     Comment: (NOTE) The Coronavirus on the Respiratory Panel, DOES NOT test for the novel  Coronavirus (2019 nCoV)    Coronavirus HKU1 NOT DETECTED NOT DETECTED Final   Coronavirus NL63 NOT DETECTED NOT DETECTED Final   Coronavirus OC43 NOT DETECTED NOT DETECTED Final   Metapneumovirus NOT DETECTED NOT DETECTED Final   Rhinovirus / Enterovirus NOT DETECTED NOT DETECTED Final   Influenza A NOT DETECTED NOT DETECTED Final   Influenza B NOT DETECTED NOT DETECTED Final   Parainfluenza Virus 1 NOT DETECTED NOT DETECTED Final   Parainfluenza Virus 2 NOT DETECTED NOT DETECTED Final   Parainfluenza Virus 3 NOT DETECTED NOT DETECTED Final   Parainfluenza Virus 4 NOT DETECTED NOT DETECTED Final   Respiratory Syncytial Virus NOT DETECTED NOT DETECTED Final   Bordetella pertussis NOT DETECTED NOT DETECTED Final   Bordetella Parapertussis NOT DETECTED NOT DETECTED Final   Chlamydophila pneumoniae NOT DETECTED NOT DETECTED Final   Mycoplasma pneumoniae NOT DETECTED NOT DETECTED Final    Comment: Performed at [facility], [address] [place], [place] [address]  Culture, blood (  Routine X 2) w Reflex to ID Panel     Status: None (Preliminary result)   Collection Time: 04/16/23  1:48 AM   Specimen: BLOOD RIGHT HAND  Result Value Ref Range Status   Specimen Description BLOOD RIGHT HAND  Final   Special Requests   Final    BOTTLES DRAWN AEROBIC AND ANAEROBIC Blood Culture results may not be optimal due to an inadequate volume of blood received in culture bottles   Culture   Final    NO GROWTH 2 DAYS Performed at [facility], [address], [place], [place] [address]    Report Status PENDING  Incomplete  Gastrointestinal Panel by PCR , Stool     Status: Abnormal   Collection Time: 04/16/23  3:45 AM   Specimen: STOOL  Result Value Ref Range Status   Campylobacter species NOT DETECTED NOT DETECTED Final   Plesimonas shigelloides NOT DETECTED NOT DETECTED Final   Salmonella species NOT  DETECTED NOT DETECTED Final   Yersinia enterocolitica NOT DETECTED NOT DETECTED Final   Vibrio species NOT DETECTED NOT DETECTED Final   Vibrio cholerae NOT DETECTED NOT DETECTED Final   Enteroaggregative E coli (EAEC) NOT DETECTED NOT DETECTED Final   Enteropathogenic E coli (EPEC) NOT DETECTED NOT DETECTED Final   Enterotoxigenic E coli (ETEC) NOT DETECTED NOT DETECTED Final   Shiga like toxin producing E coli (STEC) NOT DETECTED NOT DETECTED Final   Shigella/Enteroinvasive E coli (EIEC) NOT DETECTED NOT DETECTED Final   Cryptosporidium NOT DETECTED NOT DETECTED Final   Cyclospora cayetanensis NOT DETECTED NOT DETECTED Final   Entamoeba histolytica NOT DETECTED NOT DETECTED Final   Giardia lamblia NOT DETECTED NOT DETECTED Final   Adenovirus F40/41 NOT DETECTED NOT DETECTED Final   Astrovirus DETECTED (A) NOT DETECTED Final   Norovirus GI/GII NOT DETECTED NOT DETECTED Final   Rotavirus A NOT DETECTED NOT DETECTED Final   Sapovirus (I, II, IV, and V) NOT DETECTED NOT DETECTED Final    Comment: Performed at [facility], [address] [place], [place] [address]  C Difficile Quick Screen w PCR reflex     Status: None   Collection Time: 04/16/23  3:45 AM   Specimen: STOOL  Result Value Ref Range Status   C Diff antigen NEGATIVE NEGATIVE Final   C Diff toxin NEGATIVE NEGATIVE Final   C Diff interpretation No C. difficile detected.  Final    Comment: Performed at [facility], [address], [place], [place] [address]  SARS Coronavirus 2 by RT PCR (hospital order, performed in [facility] hospital lab) *cepheid single result test* Nasopharyngeal Swab     Status: None   Collection Time: 04/16/23  4:00 AM   Specimen: Nasopharyngeal Swab; Nasal Swab  Result Value Ref Range Status   SARS Coronavirus 2 by RT PCR NEGATIVE NEGATIVE Final    Comment: Performed at [facility], [address] [place], [place] [address]    FURTHER DISCHARGE INSTRUCTIONS:   Get  Medicines reviewed and adjusted: Please take all your medications with you for your next visit with your Primary MD   Laboratory/radiological data: Please request your Primary MD to go over all hospital tests and procedure/radiological results at the follow up, please ask your Primary MD to get all Hospital records sent to his/her office.   In some cases, they will be blood work, cultures and biopsy results pending at the time of your discharge. Please request that your primary care M.D.  goes through all the records of your hospital data and follows up on these results.   Also Note the following: If you experience worsening of your admission symptoms, develop shortness of breath, life threatening emergency, suicidal or homicidal thoughts you must seek medical attention immediately by calling 911 or calling your MD immediately  if symptoms less severe.   You must read complete instructions/literature along with all the possible adverse reactions/side effects for all the Medicines you take and that have been prescribed to you. Take any new Medicines after you have completely understood and accpet all the possible adverse reactions/side effects.    Do not drive when taking Pain medications or sleeping medications (Benzodaizepines)   Do not take more than prescribed Pain, Sleep and Anxiety Medications. It is not advisable to combine anxiety,sleep and pain medications without talking with your primary care practitioner   Special Instructions: If you have smoked or chewed Tobacco  in the last 2 yrs please stop smoking, stop any regular Alcohol  and or any Recreational drug use.   Wear Seat belts while driving.   Please note: You were cared for by a hospitalist during your hospital stay. Once you are discharged, your primary care physician will handle any further medical issues. Please note that NO REFILLS for any discharge medications will be authorized once you are discharged, as it is imperative  that you return to your primary care physician (or establish a relationship with a primary care physician if you do not have one) for your post hospital discharge needs so that they can reassess your need for medications and monitor your lab values  Time coordinating discharge: Over 30 minutes  SIGNED:   Hughie Closs, MD  [facility] 04/18/2023, 10:47 AM *Please note that this is a verbal dictation therefore any spelling or grammatical errors are due to the "[employer]agon [employer] One" system interpretation. If 7PM-7AM, please contact night-coverage [email or web address]

## 2023-04-18 NOTE — Plan of Care (Signed)

## 2023-04-21 LAB — CULTURE, BLOOD (ROUTINE X 2)
Culture: NO GROWTH
Culture: NO GROWTH
Special Requests: ADEQUATE

## 2023-05-03 ENCOUNTER — Other Ambulatory Visit (HOSPITAL_COMMUNITY): Payer: Self-pay

## 2023-05-03 ENCOUNTER — Other Ambulatory Visit: Payer: Self-pay

## 2023-05-03 ENCOUNTER — Encounter: Payer: Self-pay | Admitting: Pharmacist

## 2023-05-03 DIAGNOSIS — K5900 Constipation, unspecified: Secondary | ICD-10-CM | POA: Diagnosis not present

## 2023-05-03 DIAGNOSIS — R7989 Other specified abnormal findings of blood chemistry: Secondary | ICD-10-CM | POA: Diagnosis not present

## 2023-05-03 DIAGNOSIS — A084 Viral intestinal infection, unspecified: Secondary | ICD-10-CM | POA: Diagnosis not present

## 2023-05-03 DIAGNOSIS — Z09 Encounter for follow-up examination after completed treatment for conditions other than malignant neoplasm: Secondary | ICD-10-CM | POA: Diagnosis not present

## 2023-05-03 MED ORDER — SENNOSIDES-DOCUSATE SODIUM 8.6-50 MG PO TABS
1.0000 | ORAL_TABLET | Freq: Two times a day (BID) | ORAL | 1 refills | Status: DC
  Filled 2023-05-03 – 2023-05-18 (×3): qty 60, 30d supply, fill #0

## 2023-05-03 MED ORDER — SACCHAROMYCES BOULARDII 250 MG PO CAPS
250.0000 mg | ORAL_CAPSULE | Freq: Two times a day (BID) | ORAL | 0 refills | Status: AC
  Filled 2023-05-03 – 2023-05-18 (×3): qty 40, 20d supply, fill #0

## 2023-05-08 ENCOUNTER — Other Ambulatory Visit: Payer: Self-pay

## 2023-05-09 ENCOUNTER — Other Ambulatory Visit: Payer: Self-pay

## 2023-05-09 ENCOUNTER — Other Ambulatory Visit (HOSPITAL_COMMUNITY): Payer: Self-pay

## 2023-05-15 ENCOUNTER — Other Ambulatory Visit: Payer: Self-pay

## 2023-05-18 ENCOUNTER — Other Ambulatory Visit (HOSPITAL_COMMUNITY): Payer: Self-pay

## 2023-05-18 ENCOUNTER — Other Ambulatory Visit: Payer: Self-pay

## 2023-05-18 MED ORDER — DILTIAZEM HCL ER COATED BEADS 180 MG PO CP24
180.0000 mg | ORAL_CAPSULE | Freq: Every day | ORAL | 1 refills | Status: DC
  Filled 2023-05-18: qty 90, 90d supply, fill #0
  Filled 2023-08-09: qty 90, 90d supply, fill #1

## 2023-05-19 ENCOUNTER — Other Ambulatory Visit (HOSPITAL_COMMUNITY): Payer: Self-pay

## 2023-05-30 ENCOUNTER — Other Ambulatory Visit (HOSPITAL_COMMUNITY): Payer: Self-pay

## 2023-05-30 DIAGNOSIS — I10 Essential (primary) hypertension: Secondary | ICD-10-CM | POA: Diagnosis not present

## 2023-05-30 DIAGNOSIS — F320 Major depressive disorder, single episode, mild: Secondary | ICD-10-CM | POA: Diagnosis not present

## 2023-05-30 DIAGNOSIS — Z0000 Encounter for general adult medical examination without abnormal findings: Secondary | ICD-10-CM | POA: Diagnosis not present

## 2023-05-30 DIAGNOSIS — R7303 Prediabetes: Secondary | ICD-10-CM | POA: Diagnosis not present

## 2023-05-30 DIAGNOSIS — Z111 Encounter for screening for respiratory tuberculosis: Secondary | ICD-10-CM | POA: Diagnosis not present

## 2023-05-30 DIAGNOSIS — Z6841 Body Mass Index (BMI) 40.0 and over, adult: Secondary | ICD-10-CM | POA: Diagnosis not present

## 2023-05-30 MED ORDER — LOSARTAN POTASSIUM 100 MG PO TABS
100.0000 mg | ORAL_TABLET | Freq: Every day | ORAL | 1 refills | Status: DC
  Filled 2023-05-30 – 2023-06-05 (×2): qty 90, 90d supply, fill #0
  Filled 2023-09-16: qty 90, 90d supply, fill #1

## 2023-05-30 MED ORDER — ESCITALOPRAM OXALATE 10 MG PO TABS
10.0000 mg | ORAL_TABLET | Freq: Every day | ORAL | 1 refills | Status: DC
  Filled 2023-05-30 – 2023-08-21 (×3): qty 90, 90d supply, fill #0
  Filled 2023-11-27: qty 90, 90d supply, fill #1

## 2023-05-30 MED ORDER — DILTIAZEM HCL ER COATED BEADS 180 MG PO CP24
180.0000 mg | ORAL_CAPSULE | Freq: Every day | ORAL | 1 refills | Status: DC
  Filled 2023-05-30 – 2023-11-27 (×13): qty 90, 90d supply, fill #0

## 2023-05-31 ENCOUNTER — Other Ambulatory Visit (HOSPITAL_COMMUNITY): Payer: Self-pay

## 2023-06-01 ENCOUNTER — Other Ambulatory Visit (HOSPITAL_COMMUNITY): Payer: Self-pay

## 2023-06-02 ENCOUNTER — Other Ambulatory Visit: Payer: Self-pay

## 2023-06-02 ENCOUNTER — Other Ambulatory Visit (HOSPITAL_COMMUNITY): Payer: Self-pay

## 2023-06-03 ENCOUNTER — Other Ambulatory Visit: Payer: Self-pay

## 2023-06-05 ENCOUNTER — Other Ambulatory Visit (HOSPITAL_COMMUNITY): Payer: Self-pay

## 2023-06-05 ENCOUNTER — Other Ambulatory Visit (HOSPITAL_BASED_OUTPATIENT_CLINIC_OR_DEPARTMENT_OTHER): Payer: Self-pay

## 2023-06-06 ENCOUNTER — Other Ambulatory Visit (HOSPITAL_COMMUNITY): Payer: Self-pay

## 2023-06-07 ENCOUNTER — Other Ambulatory Visit (HOSPITAL_COMMUNITY): Payer: Self-pay

## 2023-06-08 ENCOUNTER — Other Ambulatory Visit (HOSPITAL_COMMUNITY): Payer: Self-pay

## 2023-06-09 ENCOUNTER — Other Ambulatory Visit (HOSPITAL_COMMUNITY): Payer: Self-pay

## 2023-06-10 ENCOUNTER — Other Ambulatory Visit (HOSPITAL_COMMUNITY): Payer: Self-pay

## 2023-06-12 ENCOUNTER — Other Ambulatory Visit (HOSPITAL_COMMUNITY): Payer: Self-pay

## 2023-08-09 ENCOUNTER — Other Ambulatory Visit (HOSPITAL_COMMUNITY): Payer: Self-pay

## 2023-08-21 ENCOUNTER — Other Ambulatory Visit (HOSPITAL_BASED_OUTPATIENT_CLINIC_OR_DEPARTMENT_OTHER): Payer: Self-pay

## 2023-08-21 ENCOUNTER — Other Ambulatory Visit (HOSPITAL_COMMUNITY): Payer: Self-pay

## 2023-08-21 MED ORDER — IBUPROFEN 800 MG PO TABS
800.0000 mg | ORAL_TABLET | Freq: Three times a day (TID) | ORAL | 0 refills | Status: AC
  Filled 2023-08-21: qty 30, 10d supply, fill #0

## 2023-08-21 MED ORDER — ACETAMINOPHEN-CODEINE 300-30 MG PO TABS
1.0000 | ORAL_TABLET | ORAL | 0 refills | Status: AC | PRN
  Filled 2023-08-21: qty 7, 2d supply, fill #0

## 2023-08-21 MED ORDER — CHLORHEXIDINE GLUCONATE 0.12 % MT SOLN
OROMUCOSAL | 1 refills | Status: AC
  Filled 2023-08-21: qty 473, 14d supply, fill #0
  Filled 2023-08-29 – 2023-09-01 (×4): qty 473, 14d supply, fill #1

## 2023-08-22 ENCOUNTER — Encounter: Payer: Self-pay | Admitting: Pharmacist

## 2023-08-22 ENCOUNTER — Other Ambulatory Visit: Payer: Self-pay

## 2023-08-29 ENCOUNTER — Other Ambulatory Visit (HOSPITAL_COMMUNITY): Payer: Self-pay

## 2023-08-30 ENCOUNTER — Other Ambulatory Visit (HOSPITAL_COMMUNITY): Payer: Self-pay

## 2023-11-07 ENCOUNTER — Other Ambulatory Visit (HOSPITAL_COMMUNITY): Payer: Self-pay

## 2023-11-27 ENCOUNTER — Other Ambulatory Visit: Payer: Self-pay

## 2023-11-27 ENCOUNTER — Other Ambulatory Visit (HOSPITAL_COMMUNITY): Payer: Self-pay

## 2024-01-01 DIAGNOSIS — R5383 Other fatigue: Secondary | ICD-10-CM | POA: Diagnosis not present

## 2024-01-01 DIAGNOSIS — R635 Abnormal weight gain: Secondary | ICD-10-CM | POA: Diagnosis not present

## 2024-01-03 ENCOUNTER — Other Ambulatory Visit (HOSPITAL_COMMUNITY): Payer: Self-pay

## 2024-01-03 MED ORDER — LOSARTAN POTASSIUM 100 MG PO TABS
100.0000 mg | ORAL_TABLET | Freq: Every day | ORAL | 1 refills | Status: AC
  Filled 2024-01-03: qty 90, 90d supply, fill #0

## 2024-01-04 ENCOUNTER — Other Ambulatory Visit (HOSPITAL_COMMUNITY): Payer: Self-pay

## 2024-01-05 ENCOUNTER — Other Ambulatory Visit (HOSPITAL_COMMUNITY): Payer: Self-pay

## 2024-01-05 MED ORDER — DILTIAZEM HCL ER COATED BEADS 180 MG PO CP24
180.0000 mg | ORAL_CAPSULE | Freq: Every day | ORAL | 1 refills | Status: AC
  Filled 2024-01-05 – 2024-02-17 (×7): qty 90, 90d supply, fill #0

## 2024-01-06 ENCOUNTER — Other Ambulatory Visit (HOSPITAL_COMMUNITY): Payer: Self-pay

## 2024-01-14 ENCOUNTER — Ambulatory Visit: Admission: EM | Admit: 2024-01-14 | Discharge: 2024-01-14 | Disposition: A | Discharge status: Home / Self Care

## 2024-01-14 ENCOUNTER — Encounter: Payer: Self-pay | Admitting: Emergency Medicine

## 2024-01-14 DIAGNOSIS — J069 Acute upper respiratory infection, unspecified: Secondary | ICD-10-CM | POA: Diagnosis not present

## 2024-01-14 DIAGNOSIS — J209 Acute bronchitis, unspecified: Secondary | ICD-10-CM

## 2024-01-14 LAB — POC SOFIA SARS ANTIGEN FIA: SARS Coronavirus 2 Ag: NEGATIVE

## 2024-01-14 LAB — POCT INFLUENZA A/B
Influenza A, POC: NEGATIVE
Influenza B, POC: NEGATIVE

## 2024-01-14 MED ORDER — GUAIFENESIN ER 600 MG PO TB12: 600.0000 mg | ORAL_TABLET | Freq: Two times a day (BID) | ORAL | 0 refills | Status: DC

## 2024-01-14 MED ORDER — BENZONATATE 100 MG PO CAPS: 100.0000 mg | ORAL_CAPSULE | Freq: Three times a day (TID) | ORAL | 0 refills | Status: AC

## 2024-01-14 MED ORDER — PREDNISONE 50 MG PO TABS: ORAL_TABLET | ORAL | 0 refills | Status: DC

## 2024-01-14 NOTE — ED Triage Notes (Signed)
 Pt c/o productive cough (yellow-gray) sore throat, fatigue, generalized body aches  Onset 4 days ago

## 2024-01-14 NOTE — ED Provider Notes (Addendum)
 " [facility]    CSN: [id number] Arrival date & time: 01/14/24  9147      History   Chief Complaint Chief Complaint  Patient presents with   Flu type symptoms    HPI Lee Jones is a 28 y.o. male.   Pt presents today due persistent productive cough, body aches, sweats, and chills. Pt states that he has been using Mucinex , Tylenol , and ibuprofen  for symptoms. Pt states that he has had sick contacts at work. Pt denies fever, nausea, or vomiting. Pt does states that symptoms are worse when laying down at night.   The history is provided by the patient.    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 No pertinent surgical history.     Home Medications    Prior to Admission medications  Medication Sig Start Date End Date Taking? Authorizing Provider  acetaminophen -codeine  (TYLENOL  #3) 300-30 MG tablet Take 1 tablet by mouth every 4-6 hours as needed for pain. 08/21/23     chlorhexidine  (PERIDEX ) 0.12 % solution Rinse mouth with 5-10ml  twice daily for 2 weeks 08/21/23     diltiazem  (CARDIZEM  CD) 180 MG 24 hr capsule Take 180 mg by mouth at bedtime.    [provider]  diltiazem  (CARDIZEM  CD) 180 MG 24 hr capsule Take 1 capsule (180 mg total) by mouth daily. 05/30/23     diltiazem  (CARDIZEM  CD) 180 MG 24 hr capsule Take 1 capsule (180 mg total) by mouth daily. 01/05/24     EPINEPHrine  0.3 mg/0.3 mL IJ SOAJ injection Inject 0.3 mg into the muscle as needed. 11/29/21     escitalopram  (LEXAPRO ) 10 MG tablet Take 1 tablet (10 mg total) by mouth daily. 05/30/23     ibuprofen  (ADVIL ) 800 MG tablet Take 1 tablet (800 mg total) by mouth 3 (three) times daily. 08/21/23     losartan  (COZAAR ) 100 MG tablet Take 1 tablet (100 mg  total) by mouth daily. 05/30/23     losartan  (COZAAR ) 100 MG tablet Take 1 tablet (100 mg total) by mouth daily. 01/03/24     meclizine  (ANTIVERT ) 12.5 MG tablet Take 1 tablet (12.5 mg total) by mouth 3 (three) times daily as needed for dizziness. 04/15/23   Enedelia Dorna HERO, FNP  ondansetron  (ZOFRAN -ODT) 4 MG disintegrating tablet Take 1 tablet (4 mg total) by mouth every 8 (eight) hours as needed for nausea or vomiting. 04/15/23   Enedelia Dorna HERO, FNP  saccharomyces boulardii (FLORASTOR) 250 MG capsule Take 1 capsule (250 mg total) by mouth 2 (two) times daily for 21 days. 05/03/23     telmisartan  (MICARDIS ) 40 MG tablet Take 1 tablet (40 mg total) by mouth once daily for 90 days 08/04/21 08/04/21      Family History Family History  Problem Relation Age of Onset   Chronic Renal Failure Mother    High Cholesterol Mother    Hyperlipidemia Father    Heart failure Father     Social History Social History[1]   Allergies   Bee venom   Review of Systems Review of Systems   Physical Exam Triage Vital Signs ED Triage Vitals  Encounter Vitals Group     BP 01/14/24 0914 (!) 161/104  Girls Systolic BP Percentile --      Girls Diastolic BP Percentile --      Boys Systolic BP Percentile --      Boys Diastolic BP Percentile --      Pulse Rate 01/14/24 0914 92     Resp 01/14/24 0914 18     Temp 01/14/24 0914 98.7 F (37.1 C)     Temp Source 01/14/24 0914 Oral     SpO2 01/14/24 0914 97 %     Weight --      Height --      Head Circumference --      Peak Flow --      Pain Score 01/14/24 0917 6     Pain Loc --      Pain Education --      Exclude from Growth Chart --    No data found.  Updated Vital Signs BP (!) 161/104 (BP Location: Left Arm)   Pulse 92   Temp 98.7 F (37.1 C) (Oral)   Resp 18   SpO2 97%   Visual Acuity Right Eye Distance:   Left Eye Distance:   Bilateral Distance:    Right Eye Near:   Left Eye Near:    Bilateral Near:     Physical  Exam Vitals and nursing note reviewed.  Constitutional:      General: He is not in acute distress.    Appearance: Normal appearance. He is not ill-appearing, toxic-appearing or diaphoretic.  HENT:     Nose: Congestion (mildly enlarged turbinates) and rhinorrhea (clear) present.     Mouth/Throat:     Mouth: Mucous membranes are moist.     Pharynx: Oropharynx is clear. No oropharyngeal exudate or posterior oropharyngeal erythema.  Eyes:     General: No scleral icterus. Cardiovascular:     Rate and Rhythm: Normal rate and regular rhythm.     Heart sounds: Normal heart sounds.  Pulmonary:     Effort: Pulmonary effort is normal. No respiratory distress.     Breath sounds: Normal breath sounds. No wheezing or rhonchi.  Skin:    General: Skin is warm.  Neurological:     Mental Status: He is alert and oriented to person, place, and time.  Psychiatric:        Mood and Affect: Mood normal.        Behavior: Behavior normal.      UC Treatments / Results  Labs (all labs ordered are listed, but only abnormal results are displayed) Labs Reviewed  POC SOFIA SARS ANTIGEN FIA  POCT INFLUENZA A/B    EKG   Radiology No results found.  Procedures Procedures (including critical care time)  Medications Ordered in UC Medications - No data to display  Initial Impression / Assessment and Plan / UC Course  I have reviewed the triage vital signs and the nursing notes.  Pertinent labs & imaging results that were available during my care of the patient were reviewed by me and considered in my medical decision making (see chart for details).     Final Clinical Impressions(s) / UC Diagnoses   Final diagnoses:  None   Discharge Instructions   None    ED Prescriptions   None    PDMP not reviewed this encounter.    Andra Corean BROCKS, PA-C 01/14/24 9057     [1]  Social History Tobacco Use   Smoking status: Never   Smokeless tobacco: Never  Vaping Use   Vaping  status: Never Used  Substance Use Topics   Alcohol use: Yes    Comment: occasionally   Drug use: Never     Andra Corean BROCKS, PA-C 01/14/24 9047  "

## 2024-01-18 ENCOUNTER — Other Ambulatory Visit: Payer: Self-pay

## 2024-01-18 ENCOUNTER — Telehealth (HOSPITAL_COMMUNITY): Payer: Self-pay | Admitting: *Deleted

## 2024-01-18 ENCOUNTER — Ambulatory Visit (INDEPENDENT_AMBULATORY_CARE_PROVIDER_SITE_OTHER)

## 2024-01-18 ENCOUNTER — Ambulatory Visit (HOSPITAL_COMMUNITY)
Admission: EM | Admit: 2024-01-18 | Discharge: 2024-01-18 | Disposition: A | Discharge status: Home / Self Care | Source: Home / Self Care

## 2024-01-18 ENCOUNTER — Ambulatory Visit (HOSPITAL_COMMUNITY): Payer: Self-pay | Admitting: Emergency Medicine

## 2024-01-18 ENCOUNTER — Other Ambulatory Visit (HOSPITAL_COMMUNITY): Payer: Self-pay

## 2024-01-18 ENCOUNTER — Encounter (HOSPITAL_COMMUNITY): Payer: Self-pay | Admitting: Emergency Medicine

## 2024-01-18 DIAGNOSIS — R058 Other specified cough: Secondary | ICD-10-CM

## 2024-01-18 DIAGNOSIS — J189 Pneumonia, unspecified organism: Secondary | ICD-10-CM | POA: Diagnosis not present

## 2024-01-18 MED ORDER — GUAIFENESIN 400 MG PO TABS: ORAL_TABLET | ORAL | 0 refills | Status: DC

## 2024-01-18 MED ORDER — AZITHROMYCIN 250 MG PO TABS: ORAL_TABLET | ORAL | 0 refills | Status: AC

## 2024-01-18 MED ORDER — PROMETHAZINE-DM 6.25-15 MG/5ML PO SYRP: 5.0000 mL | ORAL_SOLUTION | Freq: Every evening | ORAL | 0 refills | Status: AC | PRN

## 2024-01-18 MED ORDER — AMOXICILLIN-POT CLAVULANATE 875-125 MG PO TABS
1.0000 | ORAL_TABLET | Freq: Two times a day (BID) | ORAL | 0 refills | Status: DC
  Filled 2024-01-18: qty 14, 7d supply, fill #0

## 2024-01-18 MED ORDER — PROMETHAZINE-DM 6.25-15 MG/5ML PO SYRP
5.0000 mL | ORAL_SOLUTION | Freq: Every evening | ORAL | 0 refills | Status: DC | PRN
  Filled 2024-01-18: qty 60, 12d supply, fill #0

## 2024-01-18 MED ORDER — GUAIFENESIN 400 MG PO TABS
ORAL_TABLET | ORAL | 0 refills | Status: DC
  Filled 2024-01-18: qty 30, fill #0

## 2024-01-18 MED ORDER — AMOXICILLIN-POT CLAVULANATE 875-125 MG PO TABS: 1.0000 | ORAL_TABLET | Freq: Two times a day (BID) | ORAL | 0 refills | Status: AC

## 2024-01-18 MED ORDER — AZITHROMYCIN 250 MG PO TABS
ORAL_TABLET | ORAL | 0 refills | Status: DC
  Filled 2024-01-18: qty 6, 5d supply, fill #0

## 2024-01-18 NOTE — ED Triage Notes (Signed)
 Seen 12/28.  Patient feels much worse.  Cough is worse, nasal drainage.  Coughing up yellow mucus, red flecks.  Tightness at base of lungs with coughing.  Prednisone  and tessalon  does not help.  Requesting chest xray, antibiotic.  Took tylenol  with aspirin for facial pressure

## 2024-01-18 NOTE — Progress Notes (Signed)
 No change to current plan of care based on x-ray report.  Wet read of chest x-ray and physical exam findings still concerning for pneumonia of right lower lobe.  Recommend patient finish antibiotics as prescribed.

## 2024-01-18 NOTE — ED Provider Notes (Signed)
 "    [facility]    CSN: [id number] Arrival date & time: 01/18/24  0759    HISTORY   Chief Complaint  Patient presents with   Cough   HPI Lee Jones is a pleasant, 29 y.o. male who presents to urgent care today. Patient complains of a cough for the past 2+ weeks.  Patient was seen at urgent care 4 days ago.  Patient states he was prescribed cough medication and a steroid which has not helped.  Patient states that in addition to cough he is also having nasal drainage.  States cough is productive of yellow mucus with red flecks in it.  Patient states he feels tightness in the base of his lungs when he is coughing.  On arrival, patient has significantly elevated blood pressure with otherwise normal vital signs.  Patient denies fever, body aches, chills, nausea, vomiting, diarrhea, known sick contacts.  The history is provided by the patient.  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 No pertinent surgical history.  Home Medications    Prior to Admission medications  Medication Sig Start Date End Date Taking? Authorizing Provider  acetaminophen -codeine  (TYLENOL  #3) 300-30 MG tablet Take 1 tablet by mouth every 4-6 hours as needed for pain. 08/21/23     benzonatate  (TESSALON ) 100 MG capsule Take 1 capsule (100 mg total) by mouth every 8 (eight) hours. 01/14/24   Andra Corean BROCKS, PA-C  chlorhexidine  (PERIDEX ) 0.12 % solution Rinse mouth with 5-10ml  twice daily for 2 weeks 08/21/23     diltiazem  (CARDIZEM  CD) 180 MG 24 hr capsule Take 180 mg by mouth at bedtime.    [provider]  diltiazem  (CARDIZEM  CD) 180 MG 24 hr capsule Take 1 capsule (180 mg total) by mouth daily. 05/30/23     diltiazem  (CARDIZEM  CD) 180 MG 24 hr  capsule Take 1 capsule (180 mg total) by mouth daily. 01/05/24     EPINEPHrine  0.3 mg/0.3 mL IJ SOAJ injection Inject 0.3 mg into the muscle as needed. 11/29/21     escitalopram  (LEXAPRO ) 10 MG tablet Take 1 tablet (10 mg total) by mouth daily. 05/30/23     guaiFENesin  (MUCINEX ) 600 MG 12 hr tablet Take 1 tablet (600 mg total) by mouth 2 (two) times daily for 10 days. 01/14/24 01/24/24  Andra Corean BROCKS, PA-C  ibuprofen  (ADVIL ) 800 MG tablet Take 1 tablet (800 mg total) by mouth 3 (three) times daily. 08/21/23     losartan  (COZAAR ) 100 MG tablet Take 1 tablet (100 mg total) by mouth daily. 05/30/23     losartan  (COZAAR ) 100 MG tablet Take 1 tablet (100 mg total) by mouth daily. 01/03/24     meclizine  (ANTIVERT ) 12.5 MG tablet Take 1 tablet (12.5 mg total) by mouth 3 (three) times daily as needed for dizziness. 04/15/23   Enedelia Dorna HERO, FNP  ondansetron  (ZOFRAN -ODT) 4 MG disintegrating tablet Take 1 tablet (4 mg total) by mouth every 8 (eight) hours as needed for nausea or vomiting. 04/15/23   Enedelia Dorna HERO, FNP  predniSONE  (DELTASONE ) 50 MG tablet Take 1 tab po daily for 5 days 01/14/24   Andra Corean BROCKS, PA-C  saccharomyces boulardii (FLORASTOR) 250 MG capsule Take 1 capsule (250  mg total) by mouth 2 (two) times daily for 21 days. 05/03/23     telmisartan  (MICARDIS ) 40 MG tablet Take 1 tablet (40 mg total) by mouth once daily for 90 days 08/04/21 08/04/21      Family History Family History  Problem Relation Age of Onset   Chronic Renal Failure Mother    High Cholesterol Mother    Hyperlipidemia Father    Heart failure Father    Social History Social History[1] Allergies   Bee venom  Review of Systems Review of Systems  Respiratory:  Positive for cough.    Pertinent findings revealed after performing a 14 point review of systems has been noted in the history of present illness.  Physical Exam Vital Signs BP (!) 185/118 (BP Location: Right Arm) Comment (BP Location):  large cuff on forearm  Pulse 90   Temp 98.4 F (36.9 C) (Oral)   Resp 20   SpO2 97%   No data found.  Physical Exam Vitals and nursing note reviewed.  Constitutional:      General: He is awake. He is not in acute distress.    Appearance: Normal appearance. He is well-developed and well-groomed. He is not ill-appearing.  HENT:     Head: Normocephalic and atraumatic.     Salivary Glands: Right salivary gland is not diffusely enlarged or tender. Left salivary gland is not diffusely enlarged or tender.     Right Ear: Hearing, tympanic membrane, ear canal and external ear normal.     Left Ear: Hearing, tympanic membrane, ear canal and external ear normal.     Nose: Nose normal.     Right Turbinates: Not enlarged, swollen or pale.     Left Turbinates: Not enlarged, swollen or pale.     Right Sinus: No maxillary sinus tenderness or frontal sinus tenderness.     Left Sinus: No maxillary sinus tenderness or frontal sinus tenderness.     Mouth/Throat:     Lips: Pink. No lesions.     Mouth: Mucous membranes are moist. No oral lesions.     Tongue: No lesions. Tongue does not deviate from midline.     Palate: No mass and lesions.     Pharynx: Oropharynx is clear. Uvula midline. No pharyngeal swelling, oropharyngeal exudate, posterior oropharyngeal erythema, uvula swelling or postnasal drip.     Tonsils: No tonsillar exudate. 0 on the right. 0 on the left.  Eyes:     General: Lids are normal.        Right eye: No discharge.        Left eye: No discharge.     Conjunctiva/sclera: Conjunctivae normal.     Right eye: Right conjunctiva is not injected.     Left eye: Left conjunctiva is not injected.  Neck:     Trachea: Trachea and phonation normal.  Cardiovascular:     Rate and Rhythm: Normal rate and regular rhythm.  Pulmonary:     Effort: Pulmonary effort is normal.     Breath sounds: Examination of the right-middle field reveals rales. Examination of the right-lower field reveals rales.  Rales present. No wheezing or rhonchi.  Chest:     Chest wall: No tenderness.  Musculoskeletal:        General: Normal range of motion.     Cervical back: Full passive range of motion without pain, normal range of motion and neck supple. Normal range of motion.  Lymphadenopathy:     Cervical: No cervical adenopathy.  Skin:  General: Skin is warm and dry.     Findings: No erythema or rash.  Neurological:     General: No focal deficit present.     Mental Status: He is alert and oriented to person, place, and time. Mental status is at baseline.  Psychiatric:        Attention and Perception: Attention and perception normal.        Mood and Affect: Mood and affect normal.        Speech: Speech normal.        Behavior: Behavior normal. Behavior is cooperative.        Thought Content: Thought content normal.     Visual Acuity Right Eye Distance:   Left Eye Distance:   Bilateral Distance:    Right Eye Near:   Left Eye Near:    Bilateral Near:     UC Couse / Diagnostics / Procedures:     Radiology No results found.  Procedures Procedures (including critical care time) EKG  Pending results:  Labs Reviewed - No data to display  Medications Ordered in UC: Medications - No data to display  UC Diagnoses / Final Clinical Impressions(s)   I have reviewed the triage vital signs and the nursing notes.  Pertinent labs & imaging results that were available during my care of the patient were reviewed by me and considered in my medical decision making (see chart for details).    Final diagnoses:  Productive cough  Community acquired pneumonia of right lower lobe of lung   Independent read of patient's chest x-ray is concerning for pneumonia in right lower lobe.  Patient provided with Augmentin  and azithromycin for empiric treatment.  Patient provided with an expectorant for daytime use and a cough suppressant for nighttime use.  Conservative care recommended, return precautions  advised.  Please see discharge instructions below for details of plan of care as provided to patient. ED Prescriptions     Medication Sig Dispense Auth. Provider   amoxicillin -clavulanate (AUGMENTIN ) 875-125 MG tablet Take 1 tablet by mouth 2 (two) times daily for 7 days. 14 tablet Joesph Shaver Scales, PA-C   azithromycin (ZITHROMAX) 250 MG tablet Take 2 tablets (500 mg total) by mouth daily for 1 day, THEN 1 tablet (250 mg total) daily for 4 days. 6 tablet Joesph Shaver Scales, PA-C   guaifenesin  (HUMIBID E) 400 MG TABS tablet Take 1 tablet 3 times daily as needed for chest congestion and cough 30 tablet Joesph Shaver Scales, PA-C   promethazine-dextromethorphan (PROMETHAZINE-DM) 6.25-15 MG/5ML syrup Take 5 mLs by mouth at bedtime as needed for cough. 60 mL Joesph Shaver Scales, PA-C      PDMP not reviewed this encounter.    Discharge Instructions      Your chest x-ray is concerning for pneumonia in your right lower lobe.  I recommend the following medications:  Augmentin  (amoxicillin  - clavulanic acid):  Please take one (1) dose twice daily for 7 days.This antibiotic can cause upset stomach, this will resolve once antibiotics are complete.  You are welcome to take a probiotic, eat yogurt, take Imodium while taking this medication.  Please avoid other systemic medications such as Maalox, Pepto-Bismol or milk of magnesia as they can interfere with the body's ability to absorb the antibiotics.    Z-Pak (azithromycin):  Please take two (2) tablets on day one and one tablet daily thereafter until the prescription is complete.This antibiotic can cause upset stomach, this will resolve once antibiotics are complete.  You are welcome  to take a probiotic, eat yogurt, take Imodium while taking this medication.  Please avoid other systemic medications such as Maalox, Pepto-Bismol or milk of magnesia as they can interfere with the body's ability to absorb the antibiotics.   Robitussin,  Mucinex  (guaifenesin ): This is a daytime expectorant.  This single symptom reliever helps break up chest congestion and loosen up thick nasal drainage making phlegm and drainage easier to cough up and to blow out from your nose.  I recommend taking 400 mg in either liquid or tablet form three times daily as needed.  I do not recommend the 12-hour extended relief version or doses higher than 400 mg per each dose as these often make some patients feel jittery or jumpy and can interfere with sleep.  I also do not recommend that you purchase guaifenesin  with the ingredient  DM which is dextromethorphan, a cough suppressant which I only recommend taking at bedtime.  Guaifenesin  400 mg is a safe dose for people who are being treated for high blood pressure.     Promethazine DM: Promethazine is both a nasal decongestant that dries up mucous membranes and an antinausea medication.  Promethazine often makes most patients feel fairly sleepy.  DM is dextromethorphan, a single symptom reliever which is a cough suppressant found in many over-the-counter cough medications and combination cold preparations.  Please take 5 mL before bedtime to minimize your cough which will help you sleep better.  I have sent a prescription for this medication to your pharmacy because it cannot be purchased over-the-counter.   If symptoms have not meaningfully improved in the next 5 to 7 days, please return for repeat evaluation or follow-up with your regular provider.  If symptoms have worsened in the next 3 to 5 days, please go to the emergency room for further evaluation.    Thank you for visiting urgent care today.  We appreciate the opportunity to participate in your care.       Disposition Upon Discharge:  Condition: stable for discharge home  Patient presented with an acute illness with associated systemic symptoms and significant discomfort requiring urgent management. In my opinion, this is a condition that a prudent  lay person (someone who possesses an average knowledge of health and medicine) may potentially expect to result in complications if not addressed urgently such as respiratory distress, impairment of bodily function or dysfunction of bodily organs.   Routine symptom specific, illness specific and/or disease specific instructions were discussed with the patient and/or caregiver at length.   As such, the patient has been evaluated and assessed, work-up was performed and treatment was provided in alignment with urgent care protocols and evidence based medicine.  Patient/parent/caregiver has been advised that the patient may require follow up for further testing and treatment if the symptoms continue in spite of treatment, as clinically indicated and appropriate.  Patient/parent/caregiver has been advised to return to the [facility] or PCP if no better; to PCP or the Emergency Department if new signs and symptoms develop, or if the current signs or symptoms continue to change or worsen for further workup, evaluation and treatment as clinically indicated and appropriate  The patient will follow up with their current PCP if and as advised. If the patient does not currently have a PCP we will assist them in obtaining one.   The patient may need specialty follow up if the symptoms continue, in spite of conservative treatment and management, for further workup, evaluation, consultation and treatment as clinically indicated and  appropriate.  Patient/parent/caregiver verbalized understanding and agreement of plan as discussed.  All questions were addressed during visit.  Please see discharge instructions below for further details of plan.  This office note has been dictated using Teaching laboratory technician.  Unfortunately, this method of dictation can sometimes lead to typographical or grammatical errors.  I apologize for your inconvenience in advance if this occurs.  Please do not hesitate to reach out to me if  clarification is needed.       [1]  Social History Tobacco Use   Smoking status: Never   Smokeless tobacco: Never  Vaping Use   Vaping status: Never Used  Substance Use Topics   Alcohol use: Yes    Comment: occasionally   Drug use: Never     Joesph Shaver Scales, PA-C 01/18/24 0908  "

## 2024-01-18 NOTE — Telephone Encounter (Signed)
"  error  "

## 2024-01-18 NOTE — Discharge Instructions (Signed)
 Your chest x-ray is concerning for pneumonia in your right lower lobe.  I recommend the following medications:  Augmentin  (amoxicillin  - clavulanic acid):  Please take one (1) dose twice daily for 7 days.This antibiotic can cause upset stomach, this will resolve once antibiotics are complete.  You are welcome to take a probiotic, eat yogurt, take Imodium while taking this medication.  Please avoid other systemic medications such as Maalox, Pepto-Bismol or milk of magnesia as they can interfere with the body's ability to absorb the antibiotics.    Z-Pak (azithromycin):  Please take two (2) tablets on day one and one tablet daily thereafter until the prescription is complete.This antibiotic can cause upset stomach, this will resolve once antibiotics are complete.  You are welcome to take a probiotic, eat yogurt, take Imodium while taking this medication.  Please avoid other systemic medications such as Maalox, Pepto-Bismol or milk of magnesia as they can interfere with the body's ability to absorb the antibiotics.   Robitussin, Mucinex  (guaifenesin ): This is a daytime expectorant.  This single symptom reliever helps break up chest congestion and loosen up thick nasal drainage making phlegm and drainage easier to cough up and to blow out from your nose.  I recommend taking 400 mg in either liquid or tablet form three times daily as needed.  I do not recommend the 12-hour extended relief version or doses higher than 400 mg per each dose as these often make some patients feel jittery or jumpy and can interfere with sleep.  I also do not recommend that you purchase guaifenesin  with the ingredient  DM which is dextromethorphan, a cough suppressant which I only recommend taking at bedtime.  Guaifenesin  400 mg is a safe dose for people who are being treated for high blood pressure.     Promethazine DM: Promethazine is both a nasal decongestant that dries up mucous membranes and an antinausea medication.   Promethazine often makes most patients feel fairly sleepy.  DM is dextromethorphan, a single symptom reliever which is a cough suppressant found in many over-the-counter cough medications and combination cold preparations.  Please take 5 mL before bedtime to minimize your cough which will help you sleep better.  I have sent a prescription for this medication to your pharmacy because it cannot be purchased over-the-counter.   If symptoms have not meaningfully improved in the next 5 to 7 days, please return for repeat evaluation or follow-up with your regular provider.  If symptoms have worsened in the next 3 to 5 days, please go to the emergency room for further evaluation.    Thank you for visiting urgent care today.  We appreciate the opportunity to participate in your care.

## 2024-01-19 ENCOUNTER — Other Ambulatory Visit (HOSPITAL_COMMUNITY): Payer: Self-pay

## 2024-01-19 MED ORDER — ZEPBOUND 2.5 MG/0.5ML ~~LOC~~ SOAJ
SUBCUTANEOUS | 2 refills | Status: AC
  Filled 2024-01-19 – 2024-01-24 (×4): qty 2, 28d supply, fill #0

## 2024-01-20 ENCOUNTER — Other Ambulatory Visit (HOSPITAL_COMMUNITY): Payer: Self-pay

## 2024-01-21 ENCOUNTER — Other Ambulatory Visit (HOSPITAL_COMMUNITY): Payer: Self-pay

## 2024-01-22 NOTE — Progress Notes (Signed)
 History of Present Illness:   Lee Jones is a 29 y.o. male here for   Verbally consented to the use of AI for note-taking.   Chief Complaint  Patient presents with   Follow-up    History of Present Illness Lee Jones is a 29 year old male who presents with persistent cough and fatigue following a recent diagnosis of pneumonia.  He has been experiencing persistent coughing and fatigue following a recent diagnosis of pneumonia. Initially seen at urgent care, he was prescribed Augmentin  and a Z-Pak. Despite treatment, he continues to experience significant coughing, fatigue, and shortness of breath. His oxygen saturation is 95%, and he feels more fatigued than before. The cough is productive of sputum.  He has a history of weight gain and insulin resistance. Since 2020, he has gained approximately 75 pounds, increasing from 260 to 335 pounds. He attributes some of this weight gain to stress and lack of sleep during his time in school. He has been tracking his caloric intake and exercise regimen, consuming between 2200 to 2500 calories daily, and engaging in regular workouts targeting all major muscle groups. Despite these efforts, he continues to gain weight and expresses frustration over his inability to lose weight.  He mentions concerns about low testosterone levels, which he believes may be impacting his sexual health, energy levels, and weight gain. His testosterone levels are slightly below normal.  He has a history of elevated blood pressure, which has been exacerbated by recent illness. During his last two visits to urgent care, his blood pressure was recorded at 160/100. He is currently taking losartan , which he believes is running low.  No family history of blood clots, but there is a family history of high blood pressure and his mother's death from kidney failure, which was a complication from his younger brother's birth.   Past Medical History:   Past Medical History:   Diagnosis Date   Anxiety    Depression     Past Surgical History:  History reviewed. No pertinent surgical history.  Allergies:   Allergies  Allergen Reactions   Venom-Honey Bee Hives    Family has a history of anaphylaxis and pt believes he may have the same reaction    Current Medications:   Prior to Admission medications  Medication Sig Taking? Last Dose  amoxicillin -clavulanate (AUGMENTIN ) 875-125 mg tablet Take 1 tablet by mouth 2 (two) times daily Yes   dilTIAZem  (CARDIZEM  CD) 180 MG CD capsule Take 1 capsule (180 mg total) by mouth daily. Yes Taking  escitalopram  oxalate (LEXAPRO ) 10 MG tablet Take 1 tablet (10 mg total) by mouth once daily for 90 days Yes   losartan  (COZAAR ) 100 MG tablet Take 1 tablet (100 mg total) by mouth once daily for 184 days Yes Taking  tirzepatide  (ZEPBOUND ) 2.5 mg/0.5 mL pen injector Inject 0.5 mLs (2.5 mg total) subcutaneously every 7 (seven) days Yes Taking  albuterol  MDI, PROVENTIL , VENTOLIN , PROAIR , HFA 90 mcg/actuation inhaler Inhale 2 inhalations into the lungs every 4 (four) hours as needed for Wheezing or Shortness of Breath      Family History:   Family History  Problem Relation Name Age of Onset   High blood pressure (Hypertension) Mother     Myocardial Infarction (Heart attack) Mother     Kidney failure Mother     High blood pressure (Hypertension) Father     Post-traumatic stress disorder Father      Social History:   Social History   Socioeconomic History  Marital status: Single   Number of children: 0  Tobacco Use   Smoking status: Never   Smokeless tobacco: Never  Vaping Use   Vaping status: Never Used  Substance and Sexual Activity   Alcohol use: Not Currently   Drug use: Never   Sexual activity: Yes   Social Drivers of Health   Financial Resource Strain: Low Risk  (01/22/2024)   Overall Financial Resource Strain (CARDIA)    Difficulty of Paying Living Expenses: Not hard at all  Food  Insecurity: No Food Insecurity (01/22/2024)   Hunger Vital Sign    Worried About Running Out of Food in the Last Year: Never true    Ran Out of Food in the Last Year: Never true  Transportation Needs: No Transportation Needs (01/22/2024)   PRAPARE - Administrator, Civil Service (Medical): No    Lack of Transportation (Non-Medical): No   Received from [facility]   Social Network  Housing Stability: Low Risk  (01/22/2024)   Housing Stability Vital Sign    Unable to Pay for Housing in the Last Year: No    Number of Times Moved in the Last Year: 0    Homeless in the Last Year: No    Review of Systems:   A 10 point review of systems is negative, except for the pertinent positives and negatives detailed in the HPI.  Vitals:   Vitals:   01/22/24 1422  BP: (!) 146/82  Pulse: (!) 112  SpO2: 95%  Weight: (!) 142.5 kg (314 lb 3.2 oz)  Height: 175.3 cm (5' 9)     Body mass index is 46.4 kg/m.  Physical Exam:   Physical Exam Vitals and nursing note reviewed.  Constitutional:      General: He is not in acute distress.    Appearance: Normal appearance. He is not ill-appearing, toxic-appearing or diaphoretic.  HENT:     Head: Normocephalic and atraumatic.     Right Ear: External ear normal.     Left Ear: External ear normal.  Eyes:     General:        Right eye: No discharge.        Left eye: No discharge.     Conjunctiva/sclera: Conjunctivae normal.  Cardiovascular:     Rate and Rhythm: Normal rate and regular rhythm.     Pulses: Normal pulses.     Heart sounds: Normal heart sounds. No murmur heard.    No friction rub. No gallop.  Pulmonary:     Effort: Pulmonary effort is normal. No respiratory distress.     Breath sounds: Normal breath sounds. No stridor. No wheezing, rhonchi or rales.  Chest:     Chest wall: No tenderness.  Skin:    General: Skin is warm and dry.     Capillary Refill: Capillary refill takes less than 2 seconds.  Neurological:      Mental Status: He is alert.     Assessment and Plan:  No results found for this visit on 01/22/24.     Chest Xray 01/18/23    Diagnoses and all orders for this visit:  Pneumonia due to infectious organism, unspecified laterality, unspecified part of lung -     X-ray chest PA and lateral; Future  Essential hypertension -     CBC w/auto Differential (5 Part); Future -     Hemoglobin A1C; Future -     Insulin - [facility]; Future -     Testosterone, Free+Total LC/MS -  [facility]; Future  Prediabetes -     CBC w/auto Differential (5 Part); Future -     Hemoglobin A1C; Future -     Insulin - [facility]; Future -     Testosterone, Free+Total LC/MS - [facility]; Future  Insulin resistance -     CBC w/auto Differential (5 Part); Future -     Hemoglobin A1C; Future -     Insulin - [facility]; Future -     Testosterone, Free+Total LC/MS - [facility]; Future  Low testosterone -     CBC w/auto Differential (5 Part); Future -     Hemoglobin A1C; Future -     Insulin - [facility]; Future -     Testosterone, Free+Total LC/MS - [facility]; Future  Other orders -     escitalopram  oxalate (LEXAPRO ) 10 MG tablet; Take 1 tablet (10 mg total) by mouth once daily for 90 days -     albuterol  MDI, PROVENTIL , VENTOLIN , PROAIR , HFA 90 mcg/actuation inhaler; Inhale 2 inhalations into the lungs every 4 (four) hours as needed for Wheezing or Shortness of Breath    Assessment & Plan Pneumonia Persistent cough and fatigue with decreased oxygen saturation at 95%. Previous treatment with Augmentin  and azithromycin . Current symptoms suggest possible bronchospasms. - Ordered chest x-ray to assess current status of pneumonia - Prescribed albuterol  inhaler to address potential bronchospasms  Insulin resistance High insulin resistance with no significant changes in diet or exercise. Stress and lack of sleep may contribute. Current weight management efforts include calorie tracking and exercise. Insurance does not cover  weight loss medications unless related to diabetes. - Discussed potential referral to a nutritionist - Will consider Zepbound  vials for weight management, pending insurance approval  Obesity Significant weight gain from 260 to 330 pounds since 2020. Current weight is 314 pounds. Actively engaged in exercise and calorie tracking. Insurance does not cover weight loss medications unless related to diabetes. - Discussed potential use of Zepbound  vials for weight management, pending insurance approval - Encouraged continued exercise and calorie tracking  Low testosterone Testosterone levels are on the lower side of normal, potentially impacting energy levels and sexual health. Weight loss may improve testosterone levels. - Continue to monitor testosterone levels and reassess in three months  Essential hypertension Blood pressure elevated at 160/100 during recent urgent care visits, likely exacerbated by illness and prednisone  use. Current medications include losartan  and diltiazem . - Continue to monitor blood pressure and adjust medications as needed with weight loss   Follow up 3 months,sooner if needed   This note has been created using automated tools and reviewed for accuracy by provider.  Patient received an After Visit Summary    Attestation Statement:   I personally performed the service, non-incident to. (WP)   GLENDA MACARIO HADDOCK, NP

## 2024-01-23 ENCOUNTER — Other Ambulatory Visit (HOSPITAL_COMMUNITY): Payer: Self-pay

## 2024-01-23 MED ORDER — ESCITALOPRAM OXALATE 10 MG PO TABS
10.0000 mg | ORAL_TABLET | Freq: Every day | ORAL | 1 refills | Status: AC
  Filled 2024-01-23 – 2024-02-20 (×2): qty 90, 90d supply, fill #0

## 2024-01-23 MED ORDER — ALBUTEROL SULFATE HFA 108 (90 BASE) MCG/ACT IN AERS
2.0000 | INHALATION_SPRAY | RESPIRATORY_TRACT | 1 refills | Status: AC | PRN
  Filled 2024-01-23: qty 6.7, 16d supply, fill #0

## 2024-01-24 ENCOUNTER — Other Ambulatory Visit (HOSPITAL_BASED_OUTPATIENT_CLINIC_OR_DEPARTMENT_OTHER): Payer: Self-pay

## 2024-01-24 ENCOUNTER — Other Ambulatory Visit: Payer: Self-pay

## 2024-02-02 ENCOUNTER — Encounter (HOSPITAL_COMMUNITY): Payer: Self-pay | Admitting: Orthopedic Surgery

## 2024-02-02 ENCOUNTER — Other Ambulatory Visit: Payer: Self-pay

## 2024-02-02 NOTE — Anesthesia Preprocedure Evaluation (Signed)
"                                    Anesthesia Evaluation  Patient identified by MRN, date of birth, ID band Patient awake    Reviewed: Allergy & Precautions, NPO status , Patient's Chart, lab work & pertinent test results, reviewed documented beta blocker date and time   History of Anesthesia Complications Negative for: history of anesthetic complications  Airway Mallampati: II  TM Distance: >3 FB     Dental no notable dental hx.    Pulmonary neg shortness of breath, pneumonia   breath sounds clear to auscultation       Cardiovascular hypertension, (-) angina (-) CAD and (-) Past MI  Rhythm:Regular Rate:Normal     Neuro/Psych neg Seizures PSYCHIATRIC DISORDERS Anxiety Depression       GI/Hepatic ,neg GERD  ,,(+) neg Cirrhosis        Endo/Other    Class 4 obesity  Renal/GU Renal disease     Musculoskeletal   Abdominal   Peds  Hematology   Anesthesia Other Findings   Reproductive/Obstetrics                              Anesthesia Physical Anesthesia Plan  ASA: 3  Anesthesia Plan: General   Post-op Pain Management: Regional block*   Induction: Intravenous  PONV Risk Score and Plan: 2 and Ondansetron   Airway Management Planned: LMA  Additional Equipment:   Intra-op Plan:   Post-operative Plan: Extubation in OR  Informed Consent: I have reviewed the patients History and Physical, chart, labs and discussed the procedure including the risks, benefits and alternatives for the proposed anesthesia with the patient or authorized representative who has indicated his/her understanding and acceptance.     Dental advisory given  Plan Discussed with: CRNA  Anesthesia Plan Comments: (See PAT note from 1/16)         Anesthesia Quick Evaluation  "

## 2024-02-02 NOTE — Progress Notes (Signed)
"   Case: [id number] Date/Time: 02/06/24 0942   Procedure: REPAIR, TENDON, TRICEPS (Left)   Anesthesia type: Choice   Pre-op diagnosis: LEFT DISTAL BICEPS TENDON RUPTURE   Location: [facility] ROOM 07 / [facility]   Surgeons: Beverley Evalene BIRCH, MD       DISCUSSION: Lee Jones is a 29 yo male with PMH of HTN, palpitations, anxiety, depression, obesity (BMI 45).  Patient follows with Cardiology for hx of palpitations and tachycardia. Patient has completed an Echo and stress test in 2023 which were grossly normal. Last seen in 10/2022. Advised continue diltiazem  and f/u in 6 months.  Seen at [facility] on 12/28 for URI symptoms. Prescribed cough medicine, prednisone . Seen again on 1/1 for worsening symptoms. CXR obtained was normal by radiology read. Prescribed antibiotics and cough medicine. Seen by PCP on 1/5 for follow up. Repeat CXR normal. Prescribed albuterol . Patient reported to PAT RN over the phone symptoms are resolved.  Diagnosed with tendon tear after injury at the gym. Now scheduled for surgery above.  Has not started GLP1 yet  VS: Ht 5' 9 (1.753 m)   Wt (!) 140.6 kg   BMI 45.78 kg/m   PROVIDERS: Harvey Gaetana CROME, NP   LABS: Obtain DOS   CXR 01/22/24 ([facility]):  Impression No acute cardiopulmonary process.  EKG (obtain DOS)   ETT: (03/17/2021)  Conclusions Normal BP response to exercise Normal exercise tolerance for age Normal HR response to exercise Normal Sinus Rhythm Normal stress test Normal treadmill ECG without evidence of ischemia or arrhythmia.  Echo 03/17/2021 ([facility]):  INTERPRETATION NORMAL LEFT VENTRICULAR SYSTOLIC FUNCTION   WITH MILD LVH NORMAL RIGHT VENTRICULAR SYSTOLIC FUNCTION TRIVIAL REGURGITATION NOTED (See above) NO VALVULAR STENOSIS TRIVIAL MR, TR EF >55% ________________________  Past Medical History:  Diagnosis Date   Anxiety    Depression    Hypertension    Pneumonia     Past Surgical History:  Procedure Laterality Date   WISDOM TOOTH  EXTRACTION      MEDICATIONS:  acetaminophen -codeine  (TYLENOL  #3) 300-30 MG tablet   albuterol  (VENTOLIN  HFA) 108 (90 Base) MCG/ACT inhaler   benzonatate  (TESSALON ) 100 MG capsule   chlorhexidine  (PERIDEX ) 0.12 % solution   diltiazem  (CARDIZEM  CD) 180 MG 24 hr capsule   diltiazem  (CARDIZEM  CD) 180 MG 24 hr capsule   diltiazem  (CARDIZEM  CD) 180 MG 24 hr capsule   EPINEPHrine  0.3 mg/0.3 mL IJ SOAJ injection   escitalopram  (LEXAPRO ) 10 MG tablet   guaifenesin  (HUMIBID E) 400 MG TABS tablet   ibuprofen  (ADVIL ) 800 MG tablet   losartan  (COZAAR ) 100 MG tablet   losartan  (COZAAR ) 100 MG tablet   promethazine -dextromethorphan (PROMETHAZINE -DM) 6.25-15 MG/5ML syrup   saccharomyces boulardii (FLORASTOR) 250 MG capsule   tirzepatide  (ZEPBOUND ) 2.5 MG/0.5ML Pen    Burnard CHRISTELLA Senna, PA-C [facility] Short Stay/Anesthesiology [facility] Phone [phone] 02/02/2024 7:58 PM       "

## 2024-02-02 NOTE — Progress Notes (Signed)
 Surgery orders requested via Epic inbox.

## 2024-02-02 NOTE — Progress Notes (Signed)
 Date of COVID positive in last 90 days:  Viral URI 01-14-24 and then diagnosed with pneumonia 01-18-24  PCP - Gaetana Haddock, MD Cardiologist -  Cara Lovelace, MD  Chest x-ray - 01-22-24 [facility] EKG - 04-17-23 Epic Stress Test - N/A ECHO - N/A Cardiac Cath - N/A Pacemaker/ICD device last checked:N/A Spinal Cord Stimulator:N/A  Bowel Prep - N/A  Sleep Study - Yes, neg sleep apnea CPAP -   Fasting Blood Sugar - N/A Checks Blood Sugar _____ times a day  Zepbound  (has not started) Last dose of GLP1 agonist-  N/A GLP1 instructions:  Do not take after     Last dose of SGLT-2 inhibitors-  N/A SGLT-2 instructions:  Do not take after     Blood Thinner Instructions: N/A Last dose:   Time: Aspirin Instructions:N/A Last Dose:  Activity level:  Can go up a flight of stairs and perform activities of daily living without stopping and without symptoms of chest pain or shortness of breath.  Able to exercise without symptoms  Unable to go up a flight of stairs without symptoms of     Anesthesia review:  Palpitations, tachycardia, SOB  evaluated by cardiology (denies any current symptoms).  Recent viral illness, diagnosed with URI & pneumonia on 01-18-24.   Patient states that all symptoms have resolved from viral illness have resolved.    Patient denies shortness of breath, fever, cough and chest pain at PAT appointment (completed over the phone)  Patient verbalized understanding of instructions that were given to them at the PAT appointment. Patient was also instructed that they will need to review over the PAT instructions again at home before surgery.

## 2024-02-02 NOTE — H&P (Signed)
 PREOPERATIVE H&P  Chief Complaint: LEFT DISTAL BICEPS TENDON RUPTURE  HPI: Lee Jones is a 29 y.o. male who presents with a diagnosis of LEFT DISTAL BICEPS TENDON RUPTURE. Symptoms are rated as moderate to severe, and have been worsening.  This is significantly impairing activities of daily living.  He has elected for surgical management.   Past Medical History:  Diagnosis Date   Anxiety    Depression    Hypertension    Pneumonia    Past Surgical History:  Procedure Laterality Date   WISDOM TOOTH EXTRACTION     Social History   Socioeconomic History   Marital status: Single    Spouse name: Not on file   Number of children: Not on file   Years of education: Not on file   Highest education level: Not on file  Occupational History   Not on file  Tobacco Use   Smoking status: Never   Smokeless tobacco: Never  Vaping Use   Vaping status: Never Used  Substance and Sexual Activity   Alcohol use: Not Currently   Drug use: Never   Sexual activity: Not on file  Other Topics Concern   Not on file  Social History Narrative   Not on file   Social Drivers of Health   Tobacco Use: Low Risk (02/02/2024)   Patient History    Smoking Tobacco Use: Never    Smokeless Tobacco Use: Never    Passive Exposure: Not on file  Financial Resource Strain: Low Risk  (01/22/2024)   Received from [facility]   Overall Financial Resource Strain (CARDIA)    Difficulty of Paying Living Expenses: Not hard at all  Food Insecurity: No Food Insecurity (01/22/2024)   Received from [facility]   Epic    Within the past 12 months, you worried that your food would run out before you got the money to buy more.: Never true    Within the past 12 months, the food you bought just didn't last and you didn't have money to get more.: Never true  Transportation Needs: No Transportation Needs (01/22/2024)   Received from [facility] -  Transportation    In the past 12 months, has lack of transportation kept you from medical appointments or from getting medications?: No    Lack of Transportation (Non-Medical): No  Physical Activity: Not on file  Stress: Not on file  Social Connections: Unknown (06/01/2021)   Received from [facility]   Social Network    Social Network: Not on file  Depression (PHQ2-9): Not on file  Alcohol Screen: Not on file  Housing: Low Risk  (01/22/2024)   Received from [facility]   Epic    In the last 12 months, was there a time when you were not able to pay the mortgage or rent on time?: No    In the past 12 months, how many times have you moved where you were living?: 0    At any time in the past 12 months, were you homeless or living in a shelter (including now)?: No  Utilities: Not At Risk (01/22/2024)   Received from [facility]   Epic    In the past 12 months has the electric, gas, oil, or water company threatened to shut off services in your home?: No  Health Literacy: Not on file   Family History  Problem Relation Age of Onset   Chronic Renal Failure  Mother    High Cholesterol Mother    Hyperlipidemia Father    Heart failure Father    Allergies[1] Prior to Admission medications  Medication Sig Start Date End Date Taking? Authorizing Provider  acetaminophen -codeine  (TYLENOL  #3) 300-30 MG tablet Take 1 tablet by mouth every 4-6 hours as needed for pain. 08/21/23     albuterol  (VENTOLIN  HFA) 108 (90 Base) MCG/ACT inhaler Inhale 2 puffs into the lungs every 4 (four) hours as needed for Wheezing or Shortness of Breath Patient not taking: Reported on 02/02/2024 01/22/24     benzonatate  (TESSALON ) 100 MG capsule Take 1 capsule (100 mg total) by mouth every 8 (eight) hours. Patient not taking: Reported on 02/02/2024 01/14/24   Andra Corean BROCKS, PA-C  chlorhexidine  (PERIDEX ) 0.12 % solution Rinse mouth with 5-10ml  twice daily for 2 weeks Patient not  taking: Reported on 02/02/2024 08/21/23     diltiazem  (CARDIZEM  CD) 180 MG 24 hr capsule Take 180 mg by mouth at bedtime. Patient not taking: Reported on 02/02/2024    [provider]  diltiazem  (CARDIZEM  CD) 180 MG 24 hr capsule Take 1 capsule (180 mg total) by mouth daily. Patient not taking: Reported on 02/02/2024 05/30/23     diltiazem  (CARDIZEM  CD) 180 MG 24 hr capsule Take 1 capsule (180 mg total) by mouth daily. 01/05/24     EPINEPHrine  0.3 mg/0.3 mL IJ SOAJ injection Inject 0.3 mg into the muscle as needed. 11/29/21     escitalopram  (LEXAPRO ) 10 MG tablet Take 1 tablet (10 mg total) by mouth daily. 01/22/24     guaifenesin  (HUMIBID E) 400 MG TABS tablet Take 1 tablet 3 times daily as needed for chest congestion and cough Patient not taking: Reported on 02/02/2024 01/18/24   Joesph Shaver Scales, PA-C  ibuprofen  (ADVIL ) 800 MG tablet Take 1 tablet (800 mg total) by mouth 3 (three) times daily. 08/21/23     losartan  (COZAAR ) 100 MG tablet Take 1 tablet (100 mg total) by mouth daily. Patient not taking: Reported on 02/02/2024 05/30/23     losartan  (COZAAR ) 100 MG tablet Take 1 tablet (100 mg total) by mouth daily. 01/03/24     promethazine -dextromethorphan (PROMETHAZINE -DM) 6.25-15 MG/5ML syrup Take 5 mLs by mouth at bedtime as needed for cough. Patient not taking: Reported on 02/02/2024 01/18/24   Joesph Shaver Scales, PA-C  saccharomyces boulardii (FLORASTOR) 250 MG capsule Take 1 capsule (250 mg total) by mouth 2 (two) times daily for 21 days. Patient not taking: Reported on 02/02/2024 05/03/23     tirzepatide  (ZEPBOUND ) 2.5 MG/0.5ML Pen Inject 0.5 mLs (2.5 mg total) subcutaneously every 7 (seven) days Patient taking differently: Inject into the skin. Has not started 01/19/24     telmisartan  (MICARDIS ) 40 MG tablet Take 1 tablet (40 mg total) by mouth once daily for 90 days 08/04/21 08/04/21       Positive ROS: All other systems have been reviewed and were otherwise negative with the exception  of those mentioned in the HPI and as above.  Physical Exam: General: Alert, no acute distress Cardiovascular: No pedal edema Respiratory: No cyanosis, no use of accessory musculature GI: No organomegaly, abdomen is soft and non-tender Skin: No lesions in the area of chief complaint Neurologic: Sensation intact distally Psychiatric: Patient is competent for consent with normal mood and affect Lymphatic: No axillary or cervical lymphadenopathy  MUSCULOSKELETAL: TTP left elbow, limited ROM, edema and ecchymosis present, palpable defect when attempting hook test, NVI   Imaging: MRI of the left elbow  shows complete distal biceps tendon tear with retraction 6cm proximally up the arm   Assessment: LEFT DISTAL BICEPS TENDON RUPTURE  Plan: Plan for Procedures: REPAIR, TENDON, TRICEPS  The risks benefits and alternatives were discussed with the patient including but not limited to the risks of nonoperative treatment, versus surgical intervention including infection, bleeding, nerve injury,  blood clots, cardiopulmonary complications, morbidity, mortality, among others, and they were willing to proceed.   Weightbearing: NWB LUE Orthopedic devices: splint and/or sling Showering: POD 3 Dressing: reinforce PRN Medicines: ASA, Oxy, Tylenol , Mobic, Baclofen, Zofran  (all currently pending in an order group for him on [employer], just need to be signed and sent)  Discharge: home Follow up: 02/16/24 at 9:15am    Spiro Ausborn M Irish Piech, PA-C Office [phone] 02/02/2024 9:59 PM        [1]  Allergies Allergen Reactions   Bee Venom     Family has a history of anaphylaxis and pt believes he may have the same reaction

## 2024-02-06 ENCOUNTER — Ambulatory Visit (HOSPITAL_COMMUNITY): Payer: Self-pay | Admitting: Medical

## 2024-02-06 ENCOUNTER — Encounter (HOSPITAL_COMMUNITY): Payer: Self-pay | Admitting: Orthopedic Surgery

## 2024-02-06 ENCOUNTER — Ambulatory Visit (HOSPITAL_COMMUNITY): Admission: RE | Admit: 2024-02-06 | Source: Home / Self Care | Admitting: Orthopedic Surgery

## 2024-02-06 ENCOUNTER — Encounter (HOSPITAL_COMMUNITY): Admission: RE | Disposition: A | Payer: Self-pay | Source: Home / Self Care | Attending: Orthopedic Surgery

## 2024-02-06 ENCOUNTER — Other Ambulatory Visit: Payer: Self-pay

## 2024-02-06 ENCOUNTER — Ambulatory Visit (HOSPITAL_COMMUNITY)

## 2024-02-06 DIAGNOSIS — X58XXXA Exposure to other specified factors, initial encounter: Secondary | ICD-10-CM | POA: Insufficient documentation

## 2024-02-06 DIAGNOSIS — F418 Other specified anxiety disorders: Secondary | ICD-10-CM | POA: Diagnosis not present

## 2024-02-06 DIAGNOSIS — Z8249 Family history of ischemic heart disease and other diseases of the circulatory system: Secondary | ICD-10-CM | POA: Insufficient documentation

## 2024-02-06 DIAGNOSIS — Z6841 Body Mass Index (BMI) 40.0 and over, adult: Secondary | ICD-10-CM | POA: Diagnosis not present

## 2024-02-06 DIAGNOSIS — I10 Essential (primary) hypertension: Secondary | ICD-10-CM | POA: Insufficient documentation

## 2024-02-06 DIAGNOSIS — S46212A Strain of muscle, fascia and tendon of other parts of biceps, left arm, initial encounter: Secondary | ICD-10-CM | POA: Diagnosis present

## 2024-02-06 DIAGNOSIS — E6689 Other obesity not elsewhere classified: Secondary | ICD-10-CM | POA: Insufficient documentation

## 2024-02-06 DIAGNOSIS — N289 Disorder of kidney and ureter, unspecified: Secondary | ICD-10-CM | POA: Diagnosis not present

## 2024-02-06 HISTORY — DX: Pneumonia, unspecified organism: J18.9

## 2024-02-06 HISTORY — PX: BICEPT TENODESIS: SHX5116

## 2024-02-06 LAB — BASIC METABOLIC PANEL WITH GFR
Anion gap: 10 (ref 5–15)
BUN: 21 mg/dL — ABNORMAL HIGH (ref 6–20)
CO2: 25 mmol/L (ref 22–32)
Calcium: 9.4 mg/dL (ref 8.9–10.3)
Chloride: 106 mmol/L (ref 98–111)
Creatinine, Ser: 1.16 mg/dL (ref 0.61–1.24)
GFR, Estimated: >60 mL/min
Glucose, Bld: 90 mg/dL (ref 70–99)
Potassium: 3.6 mmol/L (ref 3.5–5.1)
Sodium: 141 mmol/L (ref 135–145)

## 2024-02-06 MED ORDER — EPHEDRINE SULFATE (PRESSORS) 25 MG/5ML IV SOSY
PREFILLED_SYRINGE | INTRAVENOUS | Status: DC | PRN
  Administered 2024-02-06: 10 mg via INTRAVENOUS

## 2024-02-06 MED ORDER — ORAL CARE MOUTH RINSE
15.0000 mL | Freq: Once | OROMUCOSAL | Status: AC
  Administered 2024-02-06: 15 mL via OROMUCOSAL

## 2024-02-06 MED ORDER — MIDAZOLAM HCL (PF) 2 MG/2ML IJ SOLN: 0.5000 mg | INTRAMUSCULAR | Status: AC

## 2024-02-06 MED ORDER — CEFAZOLIN SODIUM-DEXTROSE 3-4 GM/150ML-% IV SOLN
3.0000 g | INTRAVENOUS | Status: AC
  Administered 2024-02-06: 3 g via INTRAVENOUS
  Filled 2024-02-06: qty 150

## 2024-02-06 MED ORDER — OXYCODONE HCL 5 MG/5ML PO SOLN: 5.0000 mg | Freq: Once | ORAL | Status: DC | PRN

## 2024-02-06 MED ORDER — FENTANYL CITRATE (PF) 100 MCG/2ML IJ SOLN
INTRAMUSCULAR | Status: DC | PRN
  Administered 2024-02-06 (×2): 50 ug via INTRAVENOUS

## 2024-02-06 MED ORDER — LACTATED RINGERS IV SOLN: INTRAVENOUS | Status: DC | PRN

## 2024-02-06 MED ORDER — MIDAZOLAM HCL 2 MG/2ML IJ SOLN
INTRAMUSCULAR | Status: AC
  Filled 2024-02-06: qty 2

## 2024-02-06 MED ORDER — PROPOFOL 10 MG/ML IV BOLUS
INTRAVENOUS | Status: AC
  Filled 2024-02-06: qty 20

## 2024-02-06 MED ORDER — PHENYLEPHRINE HCL (PRESSORS) 10 MG/ML IV SOLN
INTRAVENOUS | Status: DC | PRN
  Administered 2024-02-06 (×5): 80 ug via INTRAVENOUS

## 2024-02-06 MED ORDER — CHLORHEXIDINE GLUCONATE 0.12 % MT SOLN: 15.0000 mL | Freq: Once | OROMUCOSAL | Status: AC

## 2024-02-06 MED ORDER — POVIDONE-IODINE 10 % EX SWAB: 2.0000 | Freq: Once | CUTANEOUS | Status: DC

## 2024-02-06 MED ORDER — ONDANSETRON HCL 4 MG/2ML IJ SOLN
INTRAMUSCULAR | Status: AC
  Filled 2024-02-06: qty 2

## 2024-02-06 MED ORDER — BUPIVACAINE-EPINEPHRINE (PF) 0.5% -1:200000 IJ SOLN
INTRAMUSCULAR | Status: DC | PRN
  Administered 2024-02-06: 30 mL via PERINEURAL

## 2024-02-06 MED ORDER — ACETAMINOPHEN 10 MG/ML IV SOLN: 1000.0000 mg | Freq: Once | INTRAVENOUS | Status: DC | PRN

## 2024-02-06 MED ORDER — LACTATED RINGERS IV SOLN: INTRAVENOUS | Status: DC

## 2024-02-06 MED ORDER — FENTANYL CITRATE (PF) 100 MCG/2ML IJ SOLN
INTRAMUSCULAR | Status: AC
  Filled 2024-02-06: qty 2

## 2024-02-06 MED ORDER — PHENYLEPHRINE HCL-NACL 20-0.9 MG/250ML-% IV SOLN
INTRAVENOUS | Status: DC | PRN
  Administered 2024-02-06: 25 ug/min via INTRAVENOUS

## 2024-02-06 MED ORDER — ONDANSETRON HCL 4 MG/2ML IJ SOLN: 4.0000 mg | Freq: Once | INTRAMUSCULAR | Status: DC | PRN

## 2024-02-06 MED ORDER — 0.9 % SODIUM CHLORIDE (POUR BTL) OPTIME
TOPICAL | Status: DC | PRN
  Administered 2024-02-06: 1000 mL

## 2024-02-06 MED ORDER — FENTANYL CITRATE (PF) 50 MCG/ML IJ SOSY: 50.0000 ug | PREFILLED_SYRINGE | Freq: Once | INTRAMUSCULAR | Status: AC

## 2024-02-06 MED ORDER — FENTANYL CITRATE (PF) 50 MCG/ML IJ SOSY
PREFILLED_SYRINGE | INTRAMUSCULAR | Status: AC
  Administered 2024-02-06: 50 ug via INTRAVENOUS
  Filled 2024-02-06: qty 2

## 2024-02-06 MED ORDER — DEXMEDETOMIDINE HCL IN NACL 80 MCG/20ML IV SOLN
INTRAVENOUS | Status: DC | PRN
  Administered 2024-02-06: 12 ug via INTRAVENOUS

## 2024-02-06 MED ORDER — OXYCODONE HCL 5 MG PO TABS: 5.0000 mg | ORAL_TABLET | Freq: Once | ORAL | Status: DC | PRN

## 2024-02-06 MED ORDER — DEXAMETHASONE SOD PHOSPHATE PF 10 MG/ML IJ SOLN
INTRAMUSCULAR | Status: DC | PRN
  Administered 2024-02-06: 8 mg via INTRAVENOUS

## 2024-02-06 MED ORDER — DEXMEDETOMIDINE HCL IN NACL 200 MCG/50ML IV SOLN
INTRAVENOUS | Status: DC | PRN
  Administered 2024-02-06: 12 ug via INTRAVENOUS

## 2024-02-06 MED ORDER — DEXAMETHASONE SOD PHOSPHATE PF 10 MG/ML IJ SOLN
INTRAMUSCULAR | Status: AC
  Filled 2024-02-06: qty 1

## 2024-02-06 MED ORDER — FENTANYL CITRATE (PF) 50 MCG/ML IJ SOSY: 25.0000 ug | PREFILLED_SYRINGE | INTRAMUSCULAR | Status: DC | PRN

## 2024-02-06 MED ORDER — PROPOFOL 10 MG/ML IV BOLUS
INTRAVENOUS | Status: DC | PRN
  Administered 2024-02-06: 200 mg via INTRAVENOUS

## 2024-02-06 MED ORDER — LIDOCAINE HCL (CARDIAC) PF 100 MG/5ML IV SOSY
PREFILLED_SYRINGE | INTRAVENOUS | Status: DC | PRN
  Administered 2024-02-06: 100 mg via INTRATRACHEAL

## 2024-02-06 MED ORDER — MIDAZOLAM HCL 2 MG/2ML IJ SOLN
INTRAMUSCULAR | Status: AC
  Administered 2024-02-06: 2 mg via INTRAVENOUS
  Filled 2024-02-06: qty 2

## 2024-02-06 MED ORDER — ONDANSETRON HCL 4 MG/2ML IJ SOLN
INTRAMUSCULAR | Status: DC | PRN
  Administered 2024-02-06: 4 mg via INTRAVENOUS

## 2024-02-06 MED ORDER — ACETAMINOPHEN 500 MG PO TABS
1000.0000 mg | ORAL_TABLET | Freq: Once | ORAL | Status: AC
  Administered 2024-02-06: 1000 mg via ORAL
  Filled 2024-02-06: qty 2

## 2024-02-06 NOTE — Interval H&P Note (Signed)
 History and Physical Interval Note:  02/06/2024 8:59 AM  Lee Jones  has presented today for surgery, with the diagnosis of LEFT DISTAL BICEPS TENDON RUPTURE.  The various methods of treatment have been discussed with the patient and family. After consideration of risks, benefits and other options for treatment, the patient has consented to  Procedures: TENODESIS, BICEPS (Left) as a surgical intervention.  The patient's history has been reviewed, patient examined, no change in status, stable for surgery.  I have reviewed the patient's chart and labs.  Questions were answered to the patient's satisfaction.     [name]

## 2024-02-06 NOTE — Anesthesia Procedure Notes (Signed)
 Procedure Name: Intubation Date/Time: 02/06/2024 9:54 AM  Performed by: Dartha Meckel, CRNAPre-anesthesia Checklist: Patient identified, Emergency Drugs available, Suction available and Patient being monitored Patient Re-evaluated:Patient Re-evaluated prior to induction Oxygen Delivery Method: Circle system utilized Preoxygenation: Pre-oxygenation with 100% oxygen Induction Type: IV induction Ventilation: Mask ventilation without difficulty LMA: LMA inserted LMA Size: 4.0 Tube type: Oral Number of attempts: 1 Placement Confirmation: positive ETCO2 and breath sounds checked- equal and bilateral Tube secured with: Tape Dental Injury: Teeth and Oropharynx as per pre-operative assessment

## 2024-02-06 NOTE — Anesthesia Postprocedure Evaluation (Signed)
"   Anesthesia Post Note  Patient: Rayshon Boule  Procedure(s) Performed: TENODESIS, BICEPS (Left: Arm Upper)     Patient location during evaluation: PACU Anesthesia Type: General Level of consciousness: awake and alert Pain management: pain level controlled Vital Signs Assessment: post-procedure vital signs reviewed and stable Respiratory status: spontaneous breathing, nonlabored ventilation, respiratory function stable and patient connected to nasal cannula oxygen Cardiovascular status: blood pressure returned to baseline and stable Postop Assessment: no apparent nausea or vomiting Anesthetic complications: no   No notable events documented.  Last Vitals:  Vitals:   02/06/24 1211 02/06/24 1215  BP: (!) 169/91 (!) 154/83  Pulse: (!) 111 (!) 115  Resp: 15 15  Temp: 36.9 C   SpO2: 99% 90%    Last Pain:  Vitals:   02/06/24 1215  TempSrc:   PainSc: 0-No pain                 Lynwood MARLA Cornea      "

## 2024-02-06 NOTE — Transfer of Care (Signed)
 Immediate Anesthesia Transfer of Care Note  Patient: Lee Jones  Procedure(s) Performed: TENODESIS, BICEPS (Left: Arm Upper)  Patient Location: PACU  Anesthesia Type:GA combined with regional for post-op pain  Level of Consciousness: awake and alert   Airway & Oxygen Therapy: Patient Spontanous Breathing and Patient connected to face mask oxygen  Post-op Assessment: Report given to RN and Post -op Vital signs reviewed and stable  Post vital signs: Reviewed and stable  Last Vitals:  Vitals Value Taken Time  BP 173/121 02/06/24 12:09  Temp    Pulse 111 02/06/24 12:10  Resp 15 02/06/24 12:10  SpO2 99 % 02/06/24 12:10  Vitals shown include unfiled device data.  Last Pain:  Vitals:   02/06/24 0837  TempSrc:   PainSc: 0-No pain         Complications: No notable events documented.

## 2024-02-06 NOTE — Anesthesia Procedure Notes (Signed)
 Anesthesia Regional Block: Supraclavicular block   Pre-Anesthetic Checklist: , timeout performed,  Correct Patient, Correct Site, Correct Laterality,  Correct Procedure, Correct Position, site marked,  Risks and benefits discussed,  Surgical consent,  Pre-op evaluation,  At surgeon's request and post-op pain management  Laterality: Left  Prep: Maximum Sterile Barrier Precautions used, chloraprep       Needles:  Injection technique: Single-shot  Needle Type: Echogenic Needle      Needle Gauge: 20     Additional Needles:   Procedures:,,,, ultrasound used (permanent image in chart),,    Narrative:  Start time: 02/06/2024 9:10 AM End time: 02/06/2024 9:15 AM Injection made incrementally with aspirations every 5 mL.  Performed by: Personally  Anesthesiologist: Keneth Lynwood POUR, MD

## 2024-02-07 ENCOUNTER — Encounter (HOSPITAL_COMMUNITY): Payer: Self-pay | Admitting: Orthopedic Surgery

## 2024-02-07 NOTE — Op Note (Signed)
 02/06/2024  7:44 AM  PATIENT:  Lee Jones    PRE-OPERATIVE DIAGNOSIS:  LEFT DISTAL BICEPS TENDON RUPTURE  POST-OPERATIVE DIAGNOSIS:  Same  PROCEDURE:  TENODESIS, BICEPS  SURGEON:  [name], MD  ASSISTANT: Gerard Large, PA-C was present and scrubbed throughout the case, critical for completion in a timely fashion, and for retraction, instrumentation, and closure.   ANESTHESIA:   see chart  PREOPERATIVE INDICATIONS:  Lee Jones is a  29 y.o. male with a diagnosis of LEFT DISTAL BICEPS TENDON RUPTURE who failed conservative measures and elected for surgical management.    The risks benefits and alternatives were discussed with the patient preoperatively including but not limited to the risks of infection, bleeding, nerve injury, cardiopulmonary complications, the need for revision surgery, among others, and the patient was willing to proceed.  OPERATIVE IMPLANTS: endobutton fixation  OPERATIVE FINDINGS: distal biceps rupture   BLOOD LOSS: min  COMPLICATIONS: cone  TOURNIQUET TIME: see record  OPERATIVE PROCEDURE:  Patient was identified in the preoperative holding area and site was marked by me He was transported to the operating theater and placed on the table in supine position taking care to pad all bony prominences. After a preincinduction time out anesthesia was induced. The upper extremity was prepped and draped in normal sterile fashion and a pre-incision timeout was performed. He received preoperative antibiotics.   I made a  incision directly over the distal biceps and radial tuberosity.  I protected all neurovascular structures and dissected bluntly to the muscular fascia.  I made a blunt plane down to the radial tuberosity and identified that it had been stripped of the biceps tendon.  I protected the neurovascular structures and explored proximally identifying the stump of the biceps tendon.  Tendon was split and a small portion still attached to  radial tuberosity I did release this to aid in bundling and repair of the entire tendon  I did make an accessory incision proximally and bluntly dissected to confirm there was a small muscle tear where stump was palpable however no additional tendon in this location for repair again I protected all neurovascular structures and performed only blunt dissection here  I delivered this out of the wound and whipstitched the tendon and secured it to the zip loop mechanism.  Next I placed retractors for visualization of the biceps insertion site I limited the retraction to avoid tension on the superficial radial nerve and PIN I held the arm in supination  I placed a guidepin and confirmed appropriate placement with 4 x-rays of the elbow and was happy with this alignment  Next I sequentially reamed over this bicortical with the smaller reamer for zip loop passing  I then under direct fluoroscopic visualization flipped the button  I then delivered the zip loop mechanism into the radius securing the biceps tendon with a good countersink I was happy with the stability.  I then thoroughly irrigated and closed the skin in layers a sterile dressing and sling was placed  Xrays: I took 4+ xrays of the elbow showing appropriate button placement for repair. These were reviewed by me.   POST OPERATIVE PLAN: Sling full time mobilize for dvt px

## 2024-02-09 ENCOUNTER — Other Ambulatory Visit (HOSPITAL_COMMUNITY): Payer: Self-pay

## 2024-02-09 ENCOUNTER — Other Ambulatory Visit: Payer: Self-pay

## 2024-02-10 ENCOUNTER — Other Ambulatory Visit (HOSPITAL_COMMUNITY): Payer: Self-pay

## 2024-02-11 ENCOUNTER — Other Ambulatory Visit (HOSPITAL_COMMUNITY): Payer: Self-pay

## 2024-02-11 ENCOUNTER — Other Ambulatory Visit: Payer: Self-pay

## 2024-02-12 ENCOUNTER — Other Ambulatory Visit: Payer: Self-pay

## 2024-02-12 ENCOUNTER — Other Ambulatory Visit (HOSPITAL_COMMUNITY): Payer: Self-pay

## 2024-02-13 ENCOUNTER — Other Ambulatory Visit: Payer: Self-pay

## 2024-02-14 ENCOUNTER — Other Ambulatory Visit: Payer: Self-pay

## 2024-02-16 ENCOUNTER — Other Ambulatory Visit: Payer: Self-pay

## 2024-02-17 ENCOUNTER — Other Ambulatory Visit (HOSPITAL_COMMUNITY): Payer: Self-pay

## 2024-02-20 ENCOUNTER — Other Ambulatory Visit (HOSPITAL_COMMUNITY): Payer: Self-pay

## 2024-02-21 ENCOUNTER — Other Ambulatory Visit (HOSPITAL_COMMUNITY): Payer: Self-pay

## 2024-02-21 MED ORDER — ESCITALOPRAM OXALATE 10 MG PO TABS: 10.0000 mg | ORAL_TABLET | Freq: Every day | ORAL | 2 refills | Status: AC
